# Patient Record
Sex: Female | Born: 2000 | Race: White | Hispanic: No | Marital: Single | Smoking: Never smoker
Health system: Southern US, Community
[De-identification: ages and names within clinical notes are randomized; demographics above are authoritative.]

## PROBLEM LIST (undated history)

## (undated) DIAGNOSIS — R011 Cardiac murmur, unspecified: Secondary | ICD-10-CM

## (undated) DIAGNOSIS — T7840XA Allergy, unspecified, initial encounter: Secondary | ICD-10-CM

## (undated) DIAGNOSIS — J45909 Unspecified asthma, uncomplicated: Secondary | ICD-10-CM

## (undated) DIAGNOSIS — E0590 Thyrotoxicosis, unspecified without thyrotoxic crisis or storm: Secondary | ICD-10-CM

## (undated) HISTORY — PX: TOTAL THYROIDECTOMY: SHX2547

## (undated) HISTORY — DX: Cardiac murmur, unspecified: R01.1

## (undated) HISTORY — DX: Allergy, unspecified, initial encounter: T78.40XA

## (undated) HISTORY — DX: Thyrotoxicosis, unspecified without thyrotoxic crisis or storm: E05.90

## (undated) HISTORY — DX: Unspecified asthma, uncomplicated: J45.909

## (undated) HISTORY — PX: NO PAST SURGERIES: SHX2092

---
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
## 2020-01-08 ENCOUNTER — Telehealth (INDEPENDENT_AMBULATORY_CARE_PROVIDER_SITE_OTHER): Payer: Self-pay | Admitting: Pediatric Endocrinology

## 2020-01-08 DIAGNOSIS — E890 Postprocedural hypothyroidism: Secondary | ICD-10-CM

## 2020-01-08 MED ORDER — LEVOTHYROXINE SODIUM 175 MCG PO TABS: ORAL_TABLET | ORAL | 1 refills | Status: DC

## 2020-01-08 NOTE — Telephone Encounter (Signed)
  Who's calling (name and relationship to patient) : Delcie (self)  Best contact number: [phone]  Provider they see: Dr. Vanessa Weldon  Reason for call: Needs refill sent to pharmacy.    PRESCRIPTION REFILL ONLY  Name of prescription: levothyroxine (SYNTHROID) 175 MCG tablet  Pharmacy:  [id number] [place], [place] - [address]

## 2020-01-08 NOTE — Telephone Encounter (Signed)
Refill sent to pharmacy.   

## 2020-06-09 ENCOUNTER — Encounter (INDEPENDENT_AMBULATORY_CARE_PROVIDER_SITE_OTHER): Payer: Self-pay

## 2020-06-09 DIAGNOSIS — E890 Postprocedural hypothyroidism: Secondary | ICD-10-CM

## 2020-06-10 NOTE — Telephone Encounter (Signed)
Spoke with Dr. Vanessa Roscoe regarding this and she said to put in thyroid labs for patient (TSH, and Free T4)

## 2020-06-17 LAB — TSH: TSH: 2.05 u[IU]/mL (ref 0.450–4.500)

## 2020-06-17 LAB — T4, FREE: Free T4: 1.87 ng/dL — ABNORMAL HIGH (ref 0.93–1.60)

## 2020-06-25 ENCOUNTER — Other Ambulatory Visit (INDEPENDENT_AMBULATORY_CARE_PROVIDER_SITE_OTHER): Payer: Self-pay | Admitting: Pediatric Endocrinology

## 2020-06-25 DIAGNOSIS — E890 Postprocedural hypothyroidism: Secondary | ICD-10-CM

## 2020-07-16 ENCOUNTER — Encounter: Payer: Self-pay | Admitting: Family Medicine

## 2020-07-16 ENCOUNTER — Ambulatory Visit: Payer: BC Managed Care – PPO | Admitting: Family Medicine

## 2020-07-16 ENCOUNTER — Other Ambulatory Visit: Payer: Self-pay

## 2020-07-16 VITALS — BP 116/86 | HR 67 | Temp 98.0°F | Resp 16 | Ht 71.0 in | Wt 246.0 lb

## 2020-07-16 DIAGNOSIS — E669 Obesity, unspecified: Secondary | ICD-10-CM | POA: Diagnosis not present

## 2020-07-16 DIAGNOSIS — R040 Epistaxis: Secondary | ICD-10-CM

## 2020-07-16 DIAGNOSIS — Z6834 Body mass index (BMI) 34.0-34.9, adult: Secondary | ICD-10-CM

## 2020-07-16 DIAGNOSIS — R0681 Apnea, not elsewhere classified: Secondary | ICD-10-CM

## 2020-07-16 NOTE — Patient Instructions (Signed)
Nosebleed, Adult A nosebleed is when blood comes out of the nose. Nosebleeds are common and can be caused by many things. They are usually not a sign of a serious medical problem. Follow these instructions at home: When you have a nosebleed:  Sit down.  Tilt your head a little forward.  Follow these steps: 1. Pinch your nose with a clean towel or tissue. 2. Keep pinching your nose for 5 minutes. Do not let go. 3. After 5 minutes, let go of your nose. 4. If there is still bleeding, do these steps again. Keep doing these steps until the bleeding stops.  Do not put tissues or other things in your nose to stop the bleeding.  Avoid lying down or putting your head back.  Use a nose spray decongestant as told by your doctor.   After a nosebleed:  Try not to blow your nose or sniffle for several hours.  Try not to strain, lift, or bend at the waist for several days.  Aspirin and blood-thinning medicines make bleeding more likely. If you take these medicines: ? Ask your doctor if you should stop taking them or if you should change how much you take. ? Do not stop taking the medicine unless your doctor tells you to.  If your nosebleed was caused by dryness, use over-the-counter saline nasal spray or gel and humidifier as told by your doctor. This will keep the inside of your nose moist and allow it to heal. If you need to use one of these products: ? Choose one that is water-soluble. ? Use only as much as you need and use it only as often as needed. ? Do not lie down right away after you use it.  If you get nosebleeds often, talk with your doctor about treatments. These may include: ? Nasal cautery. A chemical swab or electrical device is used to lightly burn tiny blood vessels inside the nose. This helps stop or prevent nosebleeds. ? Nasal packing. A gauze or other material is placed in the nose to keep constant pressure on the bleeding area. Contact a doctor if:  You have a  fever.  You get nosebleeds often.  You are getting nosebleeds more often than usual.  You bruise very easily.  You have something stuck in your nose.  You have bleeding in your mouth.  You vomit or cough up brown material.  You get a nosebleed after you start a new medicine. Get help right away if:  You have a nosebleed after you fall or hurt your head.  Your nosebleed does not go away after 20 minutes.  You feel dizzy or weak.  You have unusual bleeding from other parts of your body.  You have unusual bruising on other parts of your body.  You get sweaty.  You vomit blood. Summary  Nosebleeds are common. They are usually not a sign of a serious medical problem.  When you have a nosebleed, sit down and tilt your head a little forward. Pinch your nose with a clean tissue for 5 minutes.  Use saline spray or saline gel and a humidifier as told by your doctor.  Get help right away if your nosebleed does not go away after 20 minutes. This information is not intended to replace advice given to you by your health care provider. Make sure you discuss any questions you have with your health care provider. Document Revised: 02/20/2019 Document Reviewed: 02/20/2019 Elsevier Patient Education  2021 [employer] Inc.  

## 2020-07-16 NOTE — Progress Notes (Signed)
Established patient visit   Patient: Kristen Davis   DOB: 2000-11-21   20 y.o. Female  MRN: [id number] Visit Date: 07/16/2020  Today's healthcare provider: Shirlee Latch, MD   Chief Complaint  Patient presents with  . Epistaxis   Subjective    Epistaxis  The bleeding has been from the left nare. This is a new problem. Episode onset: 6 months ago. The problem occurs every several days. The problem has been unchanged. The bleeding is associated with nothing. Treatments tried: vaseline, and humidifier. The treatment provided mild relief. Her past medical history is significant for frequent nosebleeds. There is no history of allergies, a bleeding disorder or sinus problems.    It will bleed for 3 hours and splatter across the floor, soaking towels. She feels lightheaded after an episode.  Can occur up to 3 times daily.  No bleeding from elsewhere or easy bruising. Patient Active Problem List   Diagnosis Date Noted  . 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 GERD (gastroesophageal reflux disease) 01/22/2018  . Epidermal inclusion cyst 07/06/2016  . Epidermoid cyst of face 07/06/2016  . Fatigue 02/09/2015  . Acanthosis nigricans 09/22/2014  . Allergic rhinitis 09/22/2014  . 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 Ejection murmur 09/22/2014  . Irregular bleeding 09/22/2014  . Murmur, cardiac 03/14/2013   Social History   Tobacco Use  . Smoking status: Never Smoker  . Smokeless tobacco: Never Used  Vaping Use  . Vaping Use: Never used  Substance Use Topics  . Alcohol use: No  . Drug use: No   No Known Allergies     Medications: Outpatient Medications Prior to Visit  Medication Sig  . albuterol (VENTOLIN HFA) 108 (90 Base) MCG/ACT inhaler Inhale into the lungs.  . hydrOXYzine (VISTARIL) 100 MG capsule Take 1 capsule (100 mg total) by mouth 3 (three) times daily as needed (for anxiety).  . Levonorgestrel 19.5 MG IUD 19.5 mg by Intrauterine route once.  Marland Kitchen  levothyroxine (SYNTHROID) 175 MCG tablet TAKE 1 TABLET BY MOUTH EVERY DAY  . spironolactone (ALDACTONE) 50 MG tablet   . tretinoin (RETIN-A) 0.05 % cream    No facility-administered medications prior to visit.    Review of Systems  Constitutional: Positive for fatigue. Negative for appetite change and diaphoresis.  HENT: Positive for nosebleeds. Negative for rhinorrhea and sneezing.   Respiratory: Negative for chest tightness and shortness of breath.   Cardiovascular: Negative for chest pain.  Allergic/Immunologic: Negative for environmental allergies.  Neurological: Positive for light-headedness.    Last CBC Lab Results  Component Value Date   WBC 5.1 07/05/2018   HGB 12.2 07/05/2018   HCT 37.3 07/05/2018   MCV 81 07/05/2018   MCH 26.6 07/05/2018   RDW 13.1 07/05/2018   PLT 316 07/05/2018   Last metabolic panel Lab Results  Component Value Date   GLUCOSE 95 11/26/2018   NA 138 11/26/2018   K 4.4 11/26/2018   CL 107 11/26/2018   CO2 26 11/26/2018   BUN 17 11/26/2018   CREATININE 0.85 11/26/2018   GFRNONAA CANCELED 02/09/2015   GFRAA CANCELED 02/09/2015   CALCIUM 9.8 11/26/2018   PROT 7.0 05/28/2018   ALBUMIN 4.3 05/28/2018   LABGLOB 2.7 05/28/2018   AGRATIO 1.6 05/28/2018   BILITOT 0.4 05/28/2018   ALKPHOS 127 (H) 05/28/2018   AST 9 05/28/2018   ALT 10 05/28/2018   Last thyroid functions Lab Results  Component Value Date   TSH 2.050 06/16/2020   T3TOTAL 74 02/03/2019  T4TOTAL 6.5 10/10/2018       Objective    BP 116/86 (BP Location: Right Arm, Patient Position: Sitting, Cuff Size: Large)   Pulse 67   Temp 98 F (36.7 C) (Oral)   Resp 16   Ht 5\' 11"  (1.803 m)   Wt 246 lb (111.6 kg)   SpO2 99%   BMI 34.31 kg/m  BP Readings from Last 3 Encounters:  07/16/20 116/86  04/07/19 118/90  03/18/19 104/80   Wt Readings from Last 3 Encounters:  07/16/20 246 lb (111.6 kg) (>99 %, Z= 2.48)*  10/07/19 245 lb (111.1 kg) (>99 %, Z= 2.44)*  04/07/19 247 lb  (112 kg) (>99 %, Z= 2.44)*   * Growth percentiles are based on CDC (Girls, 2-20 Years) data.       Physical Exam Constitutional:      General: She is not in acute distress.    Appearance: Normal appearance. She is not diaphoretic.  HENT:     Head: Normocephalic.     Nose: Nose normal.     Right Nostril: No foreign body, epistaxis, septal hematoma or occlusion.     Left Nostril: No foreign body, epistaxis, septal hematoma or occlusion.     Right Turbinates: Not enlarged or swollen.     Left Turbinates: Not enlarged or swollen.     Mouth/Throat:     Comments: Large tonsils Eyes:     Conjunctiva/sclera: Conjunctivae normal.  Cardiovascular:     Rate and Rhythm: Normal rate and regular rhythm.     Pulses: Normal pulses.     Heart sounds: Normal heart sounds. No murmur heard.   Pulmonary:     Effort: Pulmonary effort is normal. No respiratory distress.     Breath sounds: Normal breath sounds. No wheezing or rhonchi.  Musculoskeletal:     Cervical back: Neck supple.     Right lower leg: No edema.     Left lower leg: No edema.  Skin:    Coloration: Skin is not pale.     Findings: No bruising.  Neurological:     Mental Status: She is alert and oriented to person, place, and time.       No results found for any visits on 07/16/20.  Assessment & Plan     1. Epistaxis - recurrent issue - seems to be more substantial bleeds - will check platelets, but no bleeding elsewhere, so doubt thrombocytopenia - will check CBC to ensure no anemia given blood loss - referral to ENT for eval and management - discussed humidifier, avoid nasal sprays, avoiding nose picking - CBC - Ambulatory referral to ENT  2. Witnessed episode of apnea 3. Class 1 obesity without serious comorbidity with body mass index (BMI) of 34.0 to 34.9 in adult, unspecified obesity type - witnessed apnea and snoring - large tonsil and obesity may contribute as well - will obtain sleep study - she will also  f/u with ENT - [facility] Sleep Study; Future   Return if symptoms worsen or fail to improve.      I, 09/15/20, MD, have reviewed all documentation for this visit. The documentation on 07/16/20 for the exam, diagnosis, procedures, and orders are all accurate and complete.   Malani Lees, 09/15/20, MD, MPH [facility]

## 2020-07-17 LAB — CBC
Hematocrit: 36.7 % (ref 34.0–46.6)
Hemoglobin: 12 g/dL (ref 11.1–15.9)
MCH: 26.5 pg — ABNORMAL LOW (ref 26.6–33.0)
MCHC: 32.7 g/dL (ref 31.5–35.7)
MCV: 81 fL (ref 79–97)
Platelets: 446 10*3/uL (ref 150–450)
RBC: 4.53 x10E6/uL (ref 3.77–5.28)
RDW: 11.9 % (ref 11.7–15.4)
WBC: 8.2 10*3/uL (ref 3.4–10.8)

## 2020-07-20 ENCOUNTER — Telehealth: Payer: Self-pay

## 2020-07-20 ENCOUNTER — Other Ambulatory Visit (INDEPENDENT_AMBULATORY_CARE_PROVIDER_SITE_OTHER): Payer: Self-pay | Admitting: Pediatric Endocrinology

## 2020-07-20 DIAGNOSIS — E890 Postprocedural hypothyroidism: Secondary | ICD-10-CM

## 2020-07-20 NOTE — Telephone Encounter (Signed)
-----   Message from Erasmo Downer, MD sent at 07/20/2020  1:09 PM EDT ----- Normal labs

## 2020-08-06 ENCOUNTER — Other Ambulatory Visit (INDEPENDENT_AMBULATORY_CARE_PROVIDER_SITE_OTHER): Payer: Self-pay | Admitting: Pediatric Endocrinology

## 2020-08-06 DIAGNOSIS — E890 Postprocedural hypothyroidism: Secondary | ICD-10-CM

## 2020-08-19 ENCOUNTER — Ambulatory Visit: Payer: BC Managed Care – PPO | Attending: Neurology

## 2020-09-08 ENCOUNTER — Telehealth (INDEPENDENT_AMBULATORY_CARE_PROVIDER_SITE_OTHER): Payer: Self-pay | Admitting: Pediatric Endocrinology

## 2020-09-08 DIAGNOSIS — E890 Postprocedural hypothyroidism: Secondary | ICD-10-CM

## 2020-09-08 NOTE — Telephone Encounter (Signed)
Who's calling (name and relationship to patient) : Josette Copes  Best contact number: [phone]  Provider they see: Dr. Vanessa [place]   Reason for call: Would like lab orders sent to [facility] And would like to know if next visit can be virtual Call ID:      PRESCRIPTION REFILL ONLY  Name of prescription:  Pharmacy:

## 2020-09-09 NOTE — Telephone Encounter (Signed)
Spoke with patient and let her know the labs were entered, and that Dr. Vanessa Garrett gave the go ahead for the visit to be virtual.

## 2020-09-13 ENCOUNTER — Telehealth (INDEPENDENT_AMBULATORY_CARE_PROVIDER_SITE_OTHER): Payer: BC Managed Care – PPO | Admitting: Pediatric Endocrinology

## 2020-09-15 LAB — T4, FREE: Free T4: 1.96 ng/dL — ABNORMAL HIGH (ref 0.93–1.60)

## 2020-09-15 LAB — TSH: TSH: 0.87 u[IU]/mL (ref 0.450–4.500)

## 2020-09-22 NOTE — Progress Notes (Signed)
This is a Pediatric Specialist E-Visit follow up consult provided via  VIDEO CARGILITY   Kristen Davis consented to an E-Visit consult today.  Location of patient: Kristen Davis is at work (location) Location of provider: Koren Shiver is at office (location) Patient was referred by Erasmo Downer, MD   The following participants were involved in this E-Visit: Dr. Vanessa Shubert, Darnelle Bos (list of participants and their roles)  This visit was done via VIDEO   Chief Complain/ Reason for E-Visit today: Post Surgical Hypothyroidism Total time on call: 20 min Follow up: 1 year     Subjective:  Subjective  Patient Name: Kristen Davis: 21-Mar-2001  MRN: [id number]  Kristen Davis  presents Via [facility] VIDEO VISIT today for follow up evaluation and management of her hyperthyroidism  HISTORY OF PRESENT ILLNESS:   Kristen Davis is a 20 y.o. Caucasian female   Kristen Davis was accompanied by her self   1. Kristen Davis was seen by her PCP in October 2016 for a chief complaint of syncope during basketball. She stated that she had "blacked out" while shooting hoops and had difficulty recognizing her team mates. Her PCP initially though symptoms were consistent with hypoglycemia but a TSH was obtained and was <0.006. She was referred to endocrinology for further evaluation and management of her hyperthyroidism.  2. Kristen Davis was last seen in Endocrine clinic on 10/07/19 (Virtual). Since then she has continued to do well.   She had her thyroidectomy on 09/26/18.   She has continued on 175 mcg of Synthroid daily.   She has been having trouble sleeping. Her FP doc wants her to do a sleep study. She has continued to have weird dreams. She thinks that she tends to be tired all the time.   She is moving back in with her parents and then moving with her partner to a place in [place].   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   She feels that she has  been a stable weight (~250).  She feels that she is not eating as much as she should be and she is working out- but she has not seen a decrease in weight or an increase in muscle mass. She is frustrated that she is not 16 anymore.   She also has an IUD.   She has continued to have increased hair loss. She does not think that she has any areas that are bald. She thinks she mostly loses hair in the shower. She also had Covid in December.   Acne has improved.   She is not running hot or cold.   No constipation or diarrhea  No palpitations.   She has not had any issues with climbing stairs or walking in the hills.   3. Pertinent Review of Systems: Greater than 10 systems reviewed with pertinent positives listed in HPI, otherwise negative.  PAST MEDICAL, FAMILY, AND SOCIAL HISTORY  Past Medical History:  Diagnosis Date  . Allergy   . Asthma   . Heart murmur   . Hyperthyroidism   Had a history of HSP at age 20 years, hospitalized at [facility] during this time  Family History  Problem Relation Age of Onset  . ADD / ADHD Sister   . Diabetes Father   No family history of hyperthyroidism.  Maternal aunt had hypothyroidism.  No other autoimmunity   Current Outpatient Medications:  .  albuterol (VENTOLIN HFA) 108 (90 Base) MCG/ACT inhaler,  Inhale into the lungs., Disp: , Rfl:  .  hydrOXYzine (VISTARIL) 100 MG capsule, Take 1 capsule (100 mg total) by mouth 3 (three) times daily as needed (for anxiety)., Disp: 15 capsule, Rfl: 1 .  Levonorgestrel 19.5 MG IUD, 19.5 mg by Intrauterine route once., Disp: , Rfl:  .  levothyroxine (SYNTHROID) 175 MCG tablet, TAKE 1 TABLET BY MOUTH EVERY DAY, Disp: 90 tablet, Rfl: 1 .  spironolactone (ALDACTONE) 50 MG tablet, , Disp: , Rfl:  .  tretinoin (RETIN-A) 0.05 % cream, , Disp: , Rfl:   Allergies as of 09/23/2020  . (No Known Allergies)   No recent hospitalizations or surgeries   reports that she has never smoked. She has never used smokeless tobacco.  She reports that she does not drink alcohol and does not use drugs. Pediatric History  Patient Parents  . Davis,Kristen (Mother)   Other Topics Concern  . Not on file  Social History Narrative   Lives at home with mom, dad and sister     Attends [employer].    1. 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 Activities:  3. Primary Care Provider: Erasmo Downer, MD  ROS: There are no other significant problems involving Kristen Davis's other body systems.    Objective:  Objective  Vital Signs:  Virtual visit.   Ht 5\' 11"  (1.803 m)   Wt 248 lb 4.8 oz (112.6 kg)   BMI 34.63 kg/m    Ht Readings from Last 3 Encounters:  09/23/20 5\' 11"  (1.803 m) (>99 %, Z= 2.64)*  07/16/20 5\' 11"  (1.803 m) (>99 %, Z= 2.64)*  04/07/19 5' 9.69" (1.77 m) (98 %, Z= 2.14)*   * Growth percentiles are based on CDC (Girls, 2-20 Years) data.   Wt Readings from Last 3 Encounters:  09/23/20 248 lb 4.8 oz (112.6 kg) (>99 %, Z= 2.50)*  07/16/20 246 lb (111.6 kg) (>99 %, Z= 2.48)*  10/07/19 245 lb (111.1 kg) (>99 %, Z= 2.44)*   * Growth percentiles are based on CDC (Girls, 2-20 Years) data.   HC Readings from Last 3 Encounters:  No data found for [facility]   Body surface area is 2.38 meters squared. >99 %ile (Z= 2.64) based on CDC (Girls, 2-20 Years) Stature-for-age data based on Stature recorded on 09/23/2020. >99 %ile (Z= 2.50) based on CDC (Girls, 2-20 Years) weight-for-age data using vitals from 09/23/2020.   PHYSICAL EXAM:  Virtual Visit  General- appears healthy HEENT- nares clear, MMM, thyroidectomy scar well healed Lungs: normal work of breathing Skin- Normal color  Extremities- feels that she has decreased proximal muscle strength.   Labs:  Results for Kristen, Davis (MRN 09/25/2020) as of 09/22/2020 16:15  Ref. Range 09/14/2020 12:17  TSH Latest Ref Range: 0.450 - 4.500 uIU/mL 0.870  T4,Free(Direct) Latest Ref Range: 0.93 - 1.60 ng/dL [id number] (H)   Office Visit on 07/16/2020   Component Date Value Ref Range Status  . WBC 07/16/2020 8.2  3.4 - 10.8 x10E3/uL Final  . RBC 07/16/2020 4.53  3.77 - 5.28 x10E6/uL Final  . Hemoglobin 07/16/2020 12.0  11.1 - 15.9 g/dL Final  . Hematocrit 09/15/2020 36.7  34.0 - 46.6 % Final  . MCV 07/16/2020 81  79 - 97 fL Final  . MCH 07/16/2020 26.5* 26.6 - 33.0 pg Final  . MCHC 07/16/2020 32.7  31.5 - 35.7 g/dL Final  . RDW 09/15/2020 11.9  11.7 - 15.4 % Final  . Platelets 07/16/2020 446  150 -  450 x10E3/uL Final           Assessment and Plan:   Breanah is a 21 y.o. 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 She is now post thyroidectomy on 09/26/18. She is being managed for post-surgical hypothyroidism.    1. Post Surgical Hypothyroidism - Thyroidectomy on 09/26/18 by Dr. Farley Ly at [facility] - Had extrathyroidal thyroid tissue which was also removed and had similar pathology to the primary gland - Now on 175 mcg of Synthroid daily  - Clinically and chemically over-treated vs residual thyroid tissue - will decrease back to 150 mcg   - Will plan for repeat labs and dose adjustment between visits  No follow-ups on file.    [name], MD   >30 minutes spent today reviewing the medical chart, counseling the patient/family, and documenting today's encounter.

## 2020-09-23 ENCOUNTER — Telehealth (INDEPENDENT_AMBULATORY_CARE_PROVIDER_SITE_OTHER): Payer: BC Managed Care – PPO | Admitting: Pediatric Endocrinology

## 2020-09-23 ENCOUNTER — Encounter (INDEPENDENT_AMBULATORY_CARE_PROVIDER_SITE_OTHER): Payer: Self-pay | Admitting: Pediatric Endocrinology

## 2020-09-23 ENCOUNTER — Encounter (INDEPENDENT_AMBULATORY_CARE_PROVIDER_SITE_OTHER): Payer: Self-pay

## 2020-09-23 ENCOUNTER — Other Ambulatory Visit: Payer: Self-pay

## 2020-09-23 VITALS — Ht 71.0 in | Wt 248.3 lb

## 2020-09-23 DIAGNOSIS — E890 Postprocedural hypothyroidism: Secondary | ICD-10-CM | POA: Diagnosis not present

## 2020-09-23 MED ORDER — LEVOTHYROXINE SODIUM 150 MCG PO TABS: 150.0000 ug | ORAL_TABLET | Freq: Every day | ORAL | 3 refills | Status: DC

## 2020-10-15 ENCOUNTER — Telehealth (INDEPENDENT_AMBULATORY_CARE_PROVIDER_SITE_OTHER): Payer: Self-pay | Admitting: Pediatric Endocrinology

## 2020-10-15 NOTE — Telephone Encounter (Signed)
Who's calling (name and relationship to patient) : Kristen Davis  Best contact number: [phone].  Provider they see: Dr. Vanessa Premont  Reason for call:   Call ID:      PRESCRIPTION REFILL ONLY  Name of prescription: Levothyroxine  Pharmacy: [facility] [place] [address]

## 2020-10-18 ENCOUNTER — Other Ambulatory Visit (INDEPENDENT_AMBULATORY_CARE_PROVIDER_SITE_OTHER): Payer: Self-pay

## 2020-10-18 DIAGNOSIS — E890 Postprocedural hypothyroidism: Secondary | ICD-10-CM

## 2020-10-18 MED ORDER — LEVOTHYROXINE SODIUM 150 MCG PO TABS: 150.0000 ug | ORAL_TABLET | Freq: Every day | ORAL | 3 refills | Status: DC

## 2020-10-18 NOTE — Telephone Encounter (Signed)
Refill sent.

## 2020-11-01 ENCOUNTER — Telehealth (INDEPENDENT_AMBULATORY_CARE_PROVIDER_SITE_OTHER): Payer: Self-pay | Admitting: Pediatric Endocrinology

## 2020-11-01 NOTE — Telephone Encounter (Signed)
Lab orders were placed last month for patient to get done. There is a message scheduled for tomorrow, 6/28, letting patient that the labs have been placed and she is due to get labs done.

## 2020-11-01 NOTE — Telephone Encounter (Signed)
Patient also notified.  

## 2020-11-01 NOTE — Telephone Encounter (Signed)
  Who's calling (name and relationship to patient) :[name] (Self)  Best contact number: [phone] (Home) Provider they see: [name], MD Reason for call: B[place] is requesting lab orders be sent to [facility]. If there are any questions or concerns please contact [place] and or when the order is complete confirm.    PRESCRIPTION REFILL ONLY  Name of prescription:  Pharmacy:

## 2020-11-03 LAB — T4, FREE: Free T4: 1.38 ng/dL (ref 0.82–1.77)

## 2020-11-03 LAB — TSH: TSH: 4.42 u[IU]/mL (ref 0.450–4.500)

## 2020-11-25 ENCOUNTER — Encounter: Payer: Self-pay | Admitting: Family Medicine

## 2020-11-25 ENCOUNTER — Ambulatory Visit: Payer: BC Managed Care – PPO | Admitting: Family Medicine

## 2020-11-25 VITALS — BP 123/69 | HR 73 | Temp 98.2°F | Resp 16 | Wt 244.5 lb

## 2020-11-25 DIAGNOSIS — R41840 Attention and concentration deficit: Secondary | ICD-10-CM | POA: Diagnosis not present

## 2020-11-25 DIAGNOSIS — E890 Postprocedural hypothyroidism: Secondary | ICD-10-CM | POA: Diagnosis not present

## 2020-11-25 DIAGNOSIS — R5382 Chronic fatigue, unspecified: Secondary | ICD-10-CM

## 2020-11-25 NOTE — Progress Notes (Signed)
Established patient visit   Patient: Kristen Davis   DOB: 2001/01/13   20 y.o. Female  MRN: [id number] Visit Date: 11/25/2020  Today's healthcare provider: Shirlee Latch, MD   Chief Complaint  Patient presents with   ADHD   Subjective    HPI HPI   Initial Evaluation Last edited by Fonda Kinder, CMA on 11/25/2020 10:38 AM.    Concentration deficit - Pt. requests evaluation for adult ADD - Pt. describes recent difficulty focusing, staying on task since transitioning to virtual education during COVID; pt. states that she has difficulty reading/writing efficiently - Pt. reports that she chronically fidgets and is often told that she "talks a lot"   Post-Surgical Hypothyroidism - Thyroid labs WNL in 6/22; pt. requests to be followed by PCP since she's aging out of peds endo - Pt. endorses decreased appetite, increased sleepiness since thyroidectomy; denies cold/heat intolerance   Medications: Outpatient Medications Prior to Visit  Medication Sig   albuterol (VENTOLIN HFA) 108 (90 Base) MCG/ACT inhaler Inhale into the lungs.   hydrOXYzine (VISTARIL) 100 MG capsule Take 1 capsule (100 mg total) by mouth 3 (three) times daily as needed (for anxiety).   Levonorgestrel 19.5 MG IUD 19.5 mg by Intrauterine route once.   levothyroxine (SYNTHROID) 150 MCG tablet Take 1 tablet (150 mcg total) by mouth daily.   spironolactone (ALDACTONE) 50 MG tablet    tretinoin (RETIN-A) 0.05 % cream    No facility-administered medications prior to visit.    Review of Systems  Constitutional:  Positive for activity change and appetite change. Negative for fever and unexpected weight change.  HENT: Negative.    Eyes: Negative.   Respiratory: Negative.  Negative for chest tightness and shortness of breath.   Cardiovascular: Negative.  Negative for chest pain and palpitations.  Gastrointestinal: Negative.   Endocrine: Negative.  Negative for cold intolerance and heat intolerance.   Musculoskeletal: Negative.   Skin: Negative.   Neurological: Negative.   Psychiatric/Behavioral:  Positive for decreased concentration.       Objective    BP 123/69   Pulse 73   Temp 98.2 F (36.8 C) (Oral)   Resp 16   Wt 244 lb 8 oz (110.9 kg)   LMP  (Within Weeks)   SpO2 98%   BMI 34.10 kg/m     Physical Exam Constitutional:      Appearance: Normal appearance. She is obese.  HENT:     Head: Normocephalic and atraumatic.     Right Ear: External ear normal.     Left Ear: External ear normal.  Eyes:     Conjunctiva/sclera: Conjunctivae normal.  Cardiovascular:     Rate and Rhythm: Normal rate and regular rhythm.     Pulses: Normal pulses.     Heart sounds: Normal heart sounds.  Pulmonary:     Effort: Pulmonary effort is normal.     Breath sounds: Normal breath sounds.  Skin:    General: Skin is warm and dry.  Neurological:     Mental Status: She is alert.  Psychiatric:        Behavior: Behavior normal.        Thought Content: Thought content normal.     No results found for any visits on 11/25/20.  Assessment & Plan     Problem List Items Addressed This Visit       Endocrine   Postsurgical hypothyroidism    Stable, will recheck thyroid panel at next visit  Other Visit Diagnoses     Concentration deficit    -  Primary - new problem - needs neuropsych eval before starting meds   Relevant Orders   Ambulatory referral to Neuropsychology   Chronic fatigue     Stable, will recheck thyroid panel at next visit        Return in about 6 months (around 05/28/2021) for CPE.      Queen Blossom, MS3  Patient seen along with MS3 student Queen Blossom. I personally evaluated this patient along with the student, and verified all aspects of the history, physical exam, and medical decision making as documented by the student. I agree with the student's documentation and have made all necessary edits.  Fadia Marlar, Marzella Schlein, MD, MPH [facility]

## 2020-11-25 NOTE — Assessment & Plan Note (Signed)
Stable, will recheck thyroid panel at next visit

## 2021-01-20 ENCOUNTER — Ambulatory Visit: Payer: Self-pay | Admitting: *Deleted

## 2021-01-20 ENCOUNTER — Other Ambulatory Visit: Payer: Self-pay

## 2021-01-20 DIAGNOSIS — R21 Rash and other nonspecific skin eruption: Secondary | ICD-10-CM

## 2021-01-20 MED ORDER — TRIAMCINOLONE ACETONIDE 0.1 % EX CREA: 1.0000 "application " | TOPICAL_CREAM | Freq: Two times a day (BID) | CUTANEOUS | 0 refills | Status: DC

## 2021-01-20 NOTE — Telephone Encounter (Signed)
Please send in triamcinolone ointment to use BID for rash. If not improving, can see [place] if available or Derm.

## 2021-01-20 NOTE — Telephone Encounter (Signed)
Per agent: " "Pt states her boyfriend gave her a necklace and it made her neck really itchy.  She cannot get it to stop.  She wants to know does she see dermatologist for that, or her pcp?"  Pt reports wore a necklace 2 weeks ago, rash at neck occurred "Where neck meets shoulders and a little on back of neck." States did not wear necklace for a while, resolved, then used perfume, reoccurred. Presently "Bumps under skin like I always have from my autoimmune disease."  Itching when rash present, not presently. Pt questioning if she should see her dermatologist. Assured pt NT would route to practice for PCPs review. Advised to alert dermatologist where she is established. Pt verbalizes understanding.     Reason for Disposition  Localized rash present > 7 days  Answer Assessment - Initial Assessment Questions 1. APPEARANCE of RASH: "Describe the rash."     BUmps under skin presently 2. LOCATION: "Where is the rash located?"      "Neck where shoulders meet, little at back of neck" 3. NUMBER: "How many spots are there?"      VAries. "Broke out 2 weeks ago from necklace" 4. SIZE: "How big are the spots?" (Inches, centimeters or compare to size of a coin)      Small 5. ONSET: "When did the rash start?"       2 weeks ago "But I've always had some bumps." 6. ITCHING: "Does the rash itch?" If Yes, ask: "How bad is the itch?"  (Scale 0-10; or none, mild, moderate, severe)     Yes a times 7. PAIN: "Does the rash hurt?" If Yes, ask: "How bad is the pain?"  (Scale 0-10; or none, mild, moderate, severe)    - NONE (0): no pain    - MILD (1-3): doesn't interfere with normal activities     - MODERATE (4-7): interferes with normal activities or awakens from sleep     - SEVERE (8-10): excruciating pain, unable to do any normal activities     no 8. OTHER SYMPTOMS: "Do you have any other symptoms?" (e.g., fever)     no  Protocols used: Rash or Redness - Localized-A-AH

## 2021-03-05 IMAGING — US US BREAST*R* LIMITED INC AXILLA
1 series · 2 of 2 positions shown · non-contrast
Comparison: Previous exam(s).

CLINICAL DATA: Patient presents as a new palpable area of concern
in the superior right breast.

EXAM:
ULTRASOUND OF THE RIGHT BREAST

[Series 1: us breast*right* limited inc axilla · 0.06mm/px · 2 of 2 slices shown]
[im 1/2]
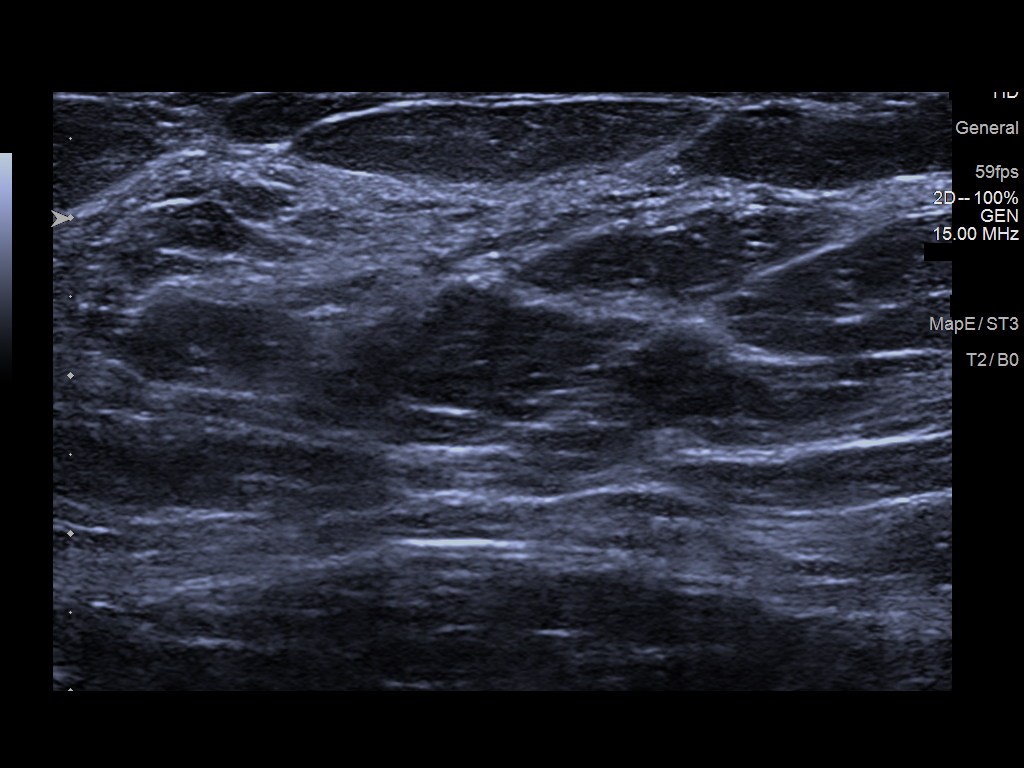
[im 2/2]
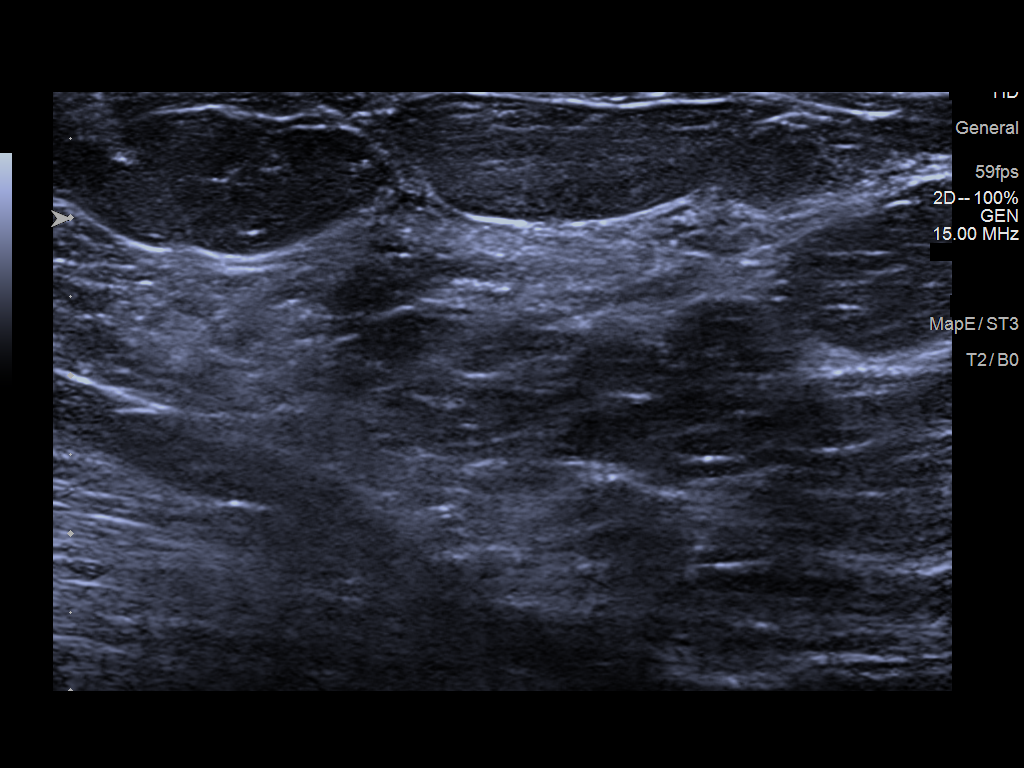

[2 of 2 positions shown; findings below may reference images not displayed]

FINDINGS: On physical exam, I palpate a smooth ridge in the superior aspect of
the right breast. No discrete fixed mass.

Targeted ultrasound is performed in the superior right breast at 12
o'clock 6 cm from the nipple in the palpable area concern
demonstrating no discrete cystic or solid mass. There is a thick
band of tissue which likely corresponds to the palpable finding.
IMPRESSION: No sonographic evidence of malignancy in the right breast. No
discrete mass identified sonographically to correspond to the
palpable finding, which is favored to represent a band of tissue.

RECOMMENDATION:
Clinical follow-up as needed for the right breast. Patient was
counseled to perform routine breast exams of this area and to return
if she notices any change in the exam.

I have discussed the findings and recommendations with the patient.
If applicable, a reminder letter will be sent to the patient
regarding the next appointment.

BI-RADS CATEGORY  1: Negative.

## 2021-05-20 ENCOUNTER — Other Ambulatory Visit: Payer: Self-pay | Admitting: Family Medicine

## 2021-05-20 DIAGNOSIS — R21 Rash and other nonspecific skin eruption: Secondary | ICD-10-CM

## 2021-06-06 ENCOUNTER — Ambulatory Visit (INDEPENDENT_AMBULATORY_CARE_PROVIDER_SITE_OTHER): Payer: BC Managed Care – PPO | Admitting: Family Medicine

## 2021-06-06 ENCOUNTER — Other Ambulatory Visit: Payer: Self-pay

## 2021-06-06 ENCOUNTER — Encounter: Payer: Self-pay | Admitting: Family Medicine

## 2021-06-06 VITALS — BP 122/63 | HR 83 | Temp 97.8°F | Resp 16 | Ht 72.0 in | Wt 241.8 lb

## 2021-06-06 DIAGNOSIS — Z114 Encounter for screening for human immunodeficiency virus [HIV]: Secondary | ICD-10-CM

## 2021-06-06 DIAGNOSIS — Z23 Encounter for immunization: Secondary | ICD-10-CM | POA: Diagnosis not present

## 2021-06-06 DIAGNOSIS — E890 Postprocedural hypothyroidism: Secondary | ICD-10-CM | POA: Diagnosis not present

## 2021-06-06 DIAGNOSIS — F909 Attention-deficit hyperactivity disorder, unspecified type: Secondary | ICD-10-CM

## 2021-06-06 DIAGNOSIS — Z0000 Encounter for general adult medical examination without abnormal findings: Secondary | ICD-10-CM | POA: Diagnosis not present

## 2021-06-06 DIAGNOSIS — E669 Obesity, unspecified: Secondary | ICD-10-CM

## 2021-06-06 DIAGNOSIS — Z6832 Body mass index (BMI) 32.0-32.9, adult: Secondary | ICD-10-CM

## 2021-06-06 DIAGNOSIS — Z1159 Encounter for screening for other viral diseases: Secondary | ICD-10-CM

## 2021-06-06 NOTE — Addendum Note (Signed)
Addended by: Fonda Kinder on: 06/06/2021 03:46 PM   Modules accepted: Orders

## 2021-06-06 NOTE — Assessment & Plan Note (Signed)
Previously well controlled Continue Synthroid at current dose  Recheck TSH and adjust Synthroid as indicated   

## 2021-06-06 NOTE — Assessment & Plan Note (Signed)
F/b [facility] No improvement with strattera Doing well on vyvanse

## 2021-06-06 NOTE — Progress Notes (Signed)
Complete physical exam   Patient: Kristen Davis   DOB: May 19, 2000   21 y.o. Female  MRN: [id number] Visit Date: 06/06/2021  Today's healthcare provider: Lavon Paganini, MD   Chief Complaint  Patient presents with   Annual Exam   Subjective    Kristen Davis is a 21 y.o. female who presents today for a complete physical exam.  She reports consuming a general diet. Gym/ health club routine includes mod to heavy weightlifting and walking. She generally feels well. She reports sleeping well. She does not have additional problems to discuss today.  HPI  IUD in place Declines STD testing  Past Medical History:  Diagnosis Date   Allergy    Asthma    Heart murmur    Hyperthyroidism    Past Surgical History:  Procedure Laterality Date   NO PAST SURGERIES     TOTAL THYROIDECTOMY     tubes in ears Bilateral 2004   Social History   Socioeconomic History   Marital status: Single    Spouse name: Not on file   Number of children: Not on file   Years of education: Not on file   Highest education level: Not on file  Occupational History   Not on file  Tobacco Use   Smoking status: Never   Smokeless tobacco: Never  Vaping Use   Vaping Use: Never used  Substance and Sexual Activity   Alcohol use: No   Drug use: No   Sexual activity: Yes    Birth control/protection: I.U.D.    Comment: Kyleena  Other Topics Concern   Not on file  Social History Narrative   Lives at home with mom, dad and sister     Attends [employer].    Social Determinants of Health   Financial Resource Strain: Not on file  Food Insecurity: Not on file  Transportation Needs: Not on file  Physical Activity: Not on file  Stress: Not on file  Social Connections: Not on file  Intimate Partner Violence: Not on file   Family Status  Relation Name Status   Sister  Alive   Father  Alive   Mother  Alive   MGM  Alive   MGF  Deceased   PGM  Alive   PGF  Alive   Family History  Problem  Relation Age of Onset   ADD / ADHD Sister    Diabetes Father    No Known Allergies  Patient Care Team: Maclaine Ahola, Dionne Bucy, MD as PCP - General (Family Medicine)   Medications: Outpatient Medications Prior to Visit  Medication Sig   albuterol (VENTOLIN HFA) 108 (90 Base) MCG/ACT inhaler Inhale into the lungs.   hydrOXYzine (VISTARIL) 100 MG capsule Take 1 capsule (100 mg total) by mouth 3 (three) times daily as needed (for anxiety).   Levonorgestrel 19.5 MG IUD 19.5 mg by Intrauterine route once.   levothyroxine (SYNTHROID) 150 MCG tablet Take 1 tablet (150 mcg total) by mouth daily.   spironolactone (ALDACTONE) 50 MG tablet    tretinoin (RETIN-A) 0.05 % cream    triamcinolone cream (KENALOG) 0.1 % APPLY TO AFFECTED AREA TWICE A DAY   VYVANSE 60 MG capsule Take 60 mg by mouth daily.   No facility-administered medications prior to visit.    Review of Systems  Constitutional: Negative.   HENT: Negative.    Eyes: Negative.   Respiratory: Negative.    Cardiovascular: Negative.   Gastrointestinal: Negative.   Endocrine: Negative.  Genitourinary: Negative.   Musculoskeletal: Negative.   Skin: Negative.   Allergic/Immunologic: Negative.   Neurological: Negative.   Hematological: Negative.   Psychiatric/Behavioral:  Positive for sleep disturbance.      Objective    BP 122/63 (BP Location: Left Arm, Patient Position: Sitting, Cuff Size: Large)    Pulse 83    Temp 97.8 F (36.6 C) (Oral)    Resp 16    Ht 6' (1.829 m)    Wt 241 lb 12.8 oz (109.7 kg)    BMI 32.79 kg/m    Physical Exam Vitals reviewed.  Constitutional:      General: She is not in acute distress.    Appearance: Normal appearance. She is well-developed. She is not diaphoretic.  HENT:     Head: Normocephalic and atraumatic.  Eyes:     General: No scleral icterus.    Conjunctiva/sclera: Conjunctivae normal.  Neck:     Thyroid: No thyromegaly.  Cardiovascular:     Rate and Rhythm: Normal rate and regular  rhythm.     Pulses: Normal pulses.     Heart sounds: Normal heart sounds. No murmur heard. Pulmonary:     Effort: Pulmonary effort is normal. No respiratory distress.     Breath sounds: Normal breath sounds. No wheezing, rhonchi or rales.  Abdominal:     General: There is no distension.     Palpations: Abdomen is soft.     Tenderness: There is no abdominal tenderness.  Musculoskeletal:     Cervical back: Neck supple.     Right lower leg: No edema.     Left lower leg: No edema.  Lymphadenopathy:     Cervical: No cervical adenopathy.  Skin:    General: Skin is warm and dry.     Findings: No rash.  Neurological:     Mental Status: She is alert and oriented to person, place, and time. Mental status is at baseline.  Psychiatric:        Mood and Affect: Mood normal.        Behavior: Behavior normal.     Last depression screening scores PHQ 2/9 Scores 06/06/2021 07/16/2020  PHQ - 2 Score 0 2  PHQ- 9 Score - 10   Last fall risk screening Fall Risk  06/06/2021  Falls in the past year? 0  Number falls in past yr: 0  Injury with Fall? 0   Last Audit-C alcohol use screening Alcohol Use Disorder Test (AUDIT) 07/16/2020  1. How often do you have a drink containing alcohol? 0  2. How many drinks containing alcohol do you have on a typical day when you are drinking? 0  3. How often do you have six or more drinks on one occasion? 0  AUDIT-C Score 0  Alcohol Brief Interventions/Follow-up AUDIT Score <7 follow-up not indicated   A score of 3 or more in women, and 4 or more in men indicates increased risk for alcohol abuse, EXCEPT if all of the points are from question 1   No results found for any visits on 06/06/21.  Assessment & Plan    Routine Health Maintenance and Physical Exam  Exercise Activities and Dietary recommendations  Goals   None     Immunization History  Administered Date(s) Administered   DTaP 01/17/2001, 03/06/2001, 04/19/2001, 04/18/2002, 01/27/2005   HPV  9-valent 12/03/2014   HPV Quadrivalent 06/03/2012, 03/13/2013, 12/03/2014   Hepatitis A 03/13/2006, 03/27/2007   Hepatitis B 12/03/00, 08/16/2001   HiB (PRP-OMP) 01/17/2001, 03/06/2001, 04/19/2001, 11/25/2001  IPV 01/17/2001, 03/06/2001, 04/18/2002, 01/27/2005   Influenza,inj,Quad PF,6+ Mos 03/13/2013, 03/17/2014, 01/22/2018   Influenza-Unspecified 03/17/2014, 03/16/2021   MMR 11/25/2001, 01/27/2005   Meningococcal Conjugate 03/13/2013   Moderna Sars-Covid-2 Vaccination 01/27/2020, 02/05/2020   Pneumococcal-Unspecified 03/06/2001, 04/19/2001, 08/16/2001, 04/18/2002   Td 05/16/2011   Tdap 05/16/2011   Varicella 11/25/2001, 03/13/2006    Health Maintenance  Topic Date Due   HIV Screening  Never done   Hepatitis C Screening  Never done   CHLAMYDIA SCREENING  03/17/2020   COVID-19 Vaccine (3 - Booster) 04/01/2020   TETANUS/TDAP  05/15/2021   INFLUENZA VACCINE  Completed   HPV VACCINES  Completed    Discussed health benefits of physical activity, and encouraged her to engage in regular exercise appropriate for her age and condition.  Problem List Items Addressed This Visit       Endocrine   Postsurgical hypothyroidism    Previously well controlled Continue Synthroid at current dose  Recheck TSH and adjust Synthroid as indicated        Relevant Orders   TSH + free T4     Other   ADHD    F/b [facility] No improvement with strattera Doing well on vyvanse      Other Visit Diagnoses     Encounter for annual physical exam    -  Primary   Relevant Orders   TSH + free T4   Comprehensive metabolic panel   Lipid panel   HIV antibody (with reflex)   Hepatitis C Antibody   Class 1 obesity without serious comorbidity with body mass index (BMI) of 32.0 to 32.9 in adult, unspecified obesity type       Relevant Medications   VYVANSE 60 MG capsule   Other Relevant Orders   Comprehensive metabolic panel   Lipid panel   Encounter for screening for HIV        Relevant Orders   HIV antibody (with reflex)   Need for hepatitis C screening test       Relevant Orders   Hepatitis C Antibody        Return in about 1 year (around 06/06/2022) for CPE.     I, Lavon Paganini, MD, have reviewed all documentation for this visit. The documentation on 06/06/21 for the exam, diagnosis, procedures, and orders are all accurate and complete.   Tavionna Grout, Dionne Bucy, MD, MPH [facility]

## 2021-06-07 LAB — COMPREHENSIVE METABOLIC PANEL
ALT: 9 IU/L (ref 0–32)
AST: 12 IU/L (ref 0–40)
Albumin/Globulin Ratio: 2 (ref 1.2–2.2)
Albumin: 4.7 g/dL (ref 3.9–5.0)
Alkaline Phosphatase: 68 IU/L (ref 42–106)
BUN/Creatinine Ratio: 19 (ref 9–23)
BUN: 17 mg/dL (ref 6–20)
Bilirubin Total: 0.5 mg/dL (ref 0.0–1.2)
CO2: 25 mmol/L (ref 20–29)
Calcium: 9.5 mg/dL (ref 8.7–10.2)
Chloride: 102 mmol/L (ref 96–106)
Creatinine, Ser: 0.91 mg/dL (ref 0.57–1.00)
Globulin, Total: 2.3 g/dL (ref 1.5–4.5)
Glucose: 93 mg/dL (ref 70–99)
Potassium: 4 mmol/L (ref 3.5–5.2)
Sodium: 141 mmol/L (ref 134–144)
Total Protein: 7 g/dL (ref 6.0–8.5)
eGFR: 93 mL/min/{1.73_m2} (ref >59)

## 2021-06-07 LAB — LIPID PANEL
Chol/HDL Ratio: 3.4 ratio (ref 0.0–4.4)
Cholesterol, Total: 142 mg/dL (ref 100–199)
HDL: 42 mg/dL (ref >39)
LDL Chol Calc (NIH): 75 mg/dL (ref 0–99)
Triglycerides: 145 mg/dL (ref 0–149)
VLDL Cholesterol Cal: 25 mg/dL (ref 5–40)

## 2021-06-07 LAB — TSH+FREE T4
Free T4: 1.56 ng/dL (ref 0.82–1.77)
TSH: 5.44 u[IU]/mL — ABNORMAL HIGH (ref 0.450–4.500)

## 2021-06-07 LAB — HIV ANTIBODY (ROUTINE TESTING W REFLEX): HIV Screen 4th Generation wRfx: NONREACTIVE

## 2021-06-07 LAB — HEPATITIS C ANTIBODY: Hep C Virus Ab: <0.1 s/co ratio (ref 0.0–0.9)

## 2021-08-19 NOTE — Progress Notes (Signed)
? ? ?I,Sulibeya S Dimas,acting as a scribe for Lavon Paganini, MD.,have documented all relevant documentation on the behalf of Lavon Paganini, MD,as directed by  Lavon Paganini, MD while in the presence of Lavon Paganini, MD. ? ? ?Complete physical exam ? ? ?Patient: Kristen Davis   DOB: 03-14-2001   21 y.o. Female  MRN: [id number] ?Visit Date: 08/22/2021 ? ?Today's healthcare provider: Lavon Paganini, MD  ? ?Chief Complaint  ?Patient presents with  ?? Annual Exam  ? ?Subjective  ?  ?Kristen Davis is a 21 y.o. female who presents today for a complete physical exam.  ?She reports consuming a general diet. Gym/ health club routine includes cardio. She generally feels well. She reports sleeping well. She does not have additional problems to discuss today.  ?HPI  ? ? ?Past Medical History:  ?Diagnosis Date  ?? Allergy   ?? Asthma   ?? Heart murmur   ?? Hyperthyroidism   ? ?Past Surgical History:  ?Procedure Laterality Date  ?? NO PAST SURGERIES    ?? TOTAL THYROIDECTOMY    ?? tubes in ears Bilateral 2004  ? ?Social History  ? ?Socioeconomic History  ?? Marital status: Single  ?  Spouse name: Not on file  ?? Number of children: Not on file  ?? Years of education: Not on file  ?? Highest education level: Not on file  ?Occupational History  ?? Not on file  ?Tobacco Use  ?? Smoking status: Never  ?? Smokeless tobacco: Never  ?Vaping Use  ?? Vaping Use: Never used  ?Substance and Sexual Activity  ?? Alcohol use: No  ?? Drug use: No  ?? Sexual activity: Yes  ?  Birth control/protection: I.U.D.  ?  Comment: Kyleena  ?Other Topics Concern  ?? Not on file  ?Social History Narrative  ? Lives at home with mom, dad and sister    ? Attends [employer].   ? ?Social Determinants of Health  ? ?Financial Resource Strain: Not on file  ?Food Insecurity: Not on file  ?Transportation Needs: Not on file  ?Physical Activity: Not on file  ?Stress: Not on file  ?Social Connections: Not on file  ?Intimate Partner Violence: Not on  file  ? ?Family Status  ?Relation Name Status  ?? Sister  Alive  ?? Father  Alive  ?? Mother  Alive  ?? MGM  Alive  ?? MGF  Deceased  ?? PGM  Alive  ?? PGF  Alive  ? ?Family History  ?Problem Relation Age of Onset  ?? ADD / ADHD Sister   ?? Diabetes Father   ? ?No Known Allergies  ?Patient Care Team: ?Virginia Crews, MD as PCP - General (Family Medicine)  ? ?Medications: ?Outpatient Medications Prior to Visit  ?Medication Sig  ?? albuterol (VENTOLIN HFA) 108 (90 Base) MCG/ACT inhaler Inhale into the lungs.  ?? hydrOXYzine (VISTARIL) 100 MG capsule Take 1 capsule (100 mg total) by mouth 3 (three) times daily as needed (for anxiety).  ?? Levonorgestrel 19.5 MG IUD 19.5 mg by Intrauterine route once.  ?? levothyroxine (SYNTHROID) 150 MCG tablet Take 1 tablet (150 mcg total) by mouth daily.  ?? spironolactone (ALDACTONE) 50 MG tablet   ?? tretinoin (RETIN-A) 0.05 % cream   ?? triamcinolone cream (KENALOG) 0.1 % APPLY TO AFFECTED AREA TWICE A DAY  ?? VYVANSE 60 MG capsule Take 60 mg by mouth daily.  ? ?No facility-administered medications prior to visit.  ? ? ?Review of Systems  ?All other systems reviewed  and are negative. ? ? ? Objective  ? ?  ?BP 122/89 (BP Location: Left Arm, Patient Position: Sitting, Cuff Size: Large)   Pulse 70   Temp 98.4 ?F (36.9 ?C) (Oral)   Resp 16   Ht 5' 10"  (1.778 m)   Wt 241 lb 12.8 oz (109.7 kg)   SpO2 99%   BMI 34.69 kg/m?  ? ? ? ?Physical Exam ?Vitals reviewed.  ?Constitutional:   ?   General: She is not in acute distress. ?   Appearance: Normal appearance. She is well-developed. She is not diaphoretic.  ?HENT:  ?   Head: Normocephalic and atraumatic.  ?   Right Ear: Tympanic membrane, ear canal and external ear normal.  ?   Left Ear: Tympanic membrane, ear canal and external ear normal.  ?   Nose: Nose normal.  ?   Mouth/Throat:  ?   Mouth: Mucous membranes are moist.  ?   Pharynx: Oropharynx is clear. No oropharyngeal exudate.  ?Eyes:  ?   General: No scleral icterus. ?    Conjunctiva/sclera: Conjunctivae normal.  ?   Pupils: Pupils are equal, round, and reactive to light.  ?Neck:  ?   Thyroid: No thyromegaly.  ?Cardiovascular:  ?   Rate and Rhythm: Normal rate and regular rhythm.  ?   Pulses: Normal pulses.  ?   Heart sounds: Normal heart sounds. No murmur heard. ?Pulmonary:  ?   Effort: Pulmonary effort is normal. No respiratory distress.  ?   Breath sounds: Normal breath sounds. No wheezing or rales.  ?Abdominal:  ?   General: There is no distension.  ?   Palpations: Abdomen is soft.  ?   Tenderness: There is no abdominal tenderness.  ?Musculoskeletal:     ?   General: No deformity.  ?   Cervical back: Neck supple.  ?   Right lower leg: No edema.  ?   Left lower leg: No edema.  ?Lymphadenopathy:  ?   Cervical: No cervical adenopathy.  ?Skin: ?   General: Skin is warm and dry.  ?   Findings: No rash.  ?Neurological:  ?   Mental Status: She is alert and oriented to person, place, and time. Mental status is at baseline.  ?   Gait: Gait normal.  ?Psychiatric:     ?   Mood and Affect: Mood normal.     ?   Behavior: Behavior normal.     ?   Thought Content: Thought content normal.  ?  ? ? ?Last depression screening scores ? ?  08/22/2021  ?  9:35 AM 06/06/2021  ?  3:13 PM 07/16/2020  ?  9:30 AM  ?PHQ 2/9 Scores  ?PHQ - 2 Score 0 0 2  ?PHQ- 9 Score 3  10  ? ?Last fall risk screening ? ?  08/22/2021  ?  9:35 AM  ?Fall Risk   ?Falls in the past year? 0  ?Number falls in past yr: 0  ?Injury with Fall? 0  ?Risk for fall due to : No Fall Risks  ?Follow up Falls evaluation completed  ? ?Last Audit-C alcohol use screening ? ?  08/22/2021  ?  9:35 AM  ?Alcohol Use Disorder Test (AUDIT)  ?1. How often do you have a drink containing alcohol? 2  ?2. How many drinks containing alcohol do you have on a typical day when you are drinking? 0  ?3. How often do you have six or more drinks on one occasion? 0  ?  AUDIT-C Score 2  ? ?A score of 3 or more in women, and 4 or more in men indicates increased risk for  alcohol abuse, EXCEPT if all of the points are from question 1  ? ?Results for orders placed or performed in visit on 08/22/21  ?POCT Urinalysis Dipstick  ?Result Value Ref Range  ? Color, UA yellow   ? Clarity, UA clear   ? Glucose, UA Negative Negative  ? Bilirubin, UA Negative   ? Ketones, UA Negative   ? Spec Grav, UA 1.015 1.010 - 1.025  ? Blood, UA Negative   ? pH, UA 6.0 5.0 - 8.0  ? Protein, UA Negative Negative  ? Urobilinogen, UA 0.2 0.2 or 1.0 E.U./dL  ? Nitrite, UA Negative   ? Leukocytes, UA Negative Negative  ? ? Assessment & Plan  ?  ?Routine Health Maintenance and Physical Exam ? ?Exercise Activities and Dietary recommendations ? Goals   ?None ?  ? ? ?Immunization History  ?Administered Date(s) Administered  ?? DTaP 01/17/2001, 03/06/2001, 04/19/2001, 04/18/2002, 01/27/2005  ?? HPV 9-valent 12/03/2014  ?? HPV Quadrivalent 06/03/2012, 03/13/2013, 12/03/2014  ?? Hepatitis A 03/13/2006, 03/27/2007  ?? Hepatitis B 11-Oct-2000, 08/16/2001  ?? HiB (PRP-OMP) 01/17/2001, 03/06/2001, 04/19/2001, 11/25/2001  ?? IPV 01/17/2001, 03/06/2001, 04/18/2002, 01/27/2005  ?? Influenza,inj,Quad PF,6+ Mos 03/13/2013, 03/17/2014, 01/22/2018  ?? Influenza-Unspecified 03/17/2014, 03/16/2021  ?? MMR 11/25/2001, 01/27/2005  ?? Meningococcal Conjugate 03/13/2013  ?? Moderna Sars-Covid-2 Vaccination 01/27/2020, 02/05/2020  ?? Pneumococcal-Unspecified 03/06/2001, 04/19/2001, 08/16/2001, 04/18/2002  ?? Td 05/16/2011  ?? Tdap 05/16/2011, 06/06/2021  ?? Varicella 11/25/2001, 03/13/2006  ? ? ?Health Maintenance  ?Topic Date Due  ?? CHLAMYDIA SCREENING  03/17/2020  ?? COVID-19 Vaccine (3 - Booster) 04/01/2020  ?? INFLUENZA VACCINE  12/06/2021  ?? TETANUS/TDAP  06/07/2031  ?? HPV VACCINES  Completed  ?? Hepatitis C Screening  Completed  ?? HIV Screening  Completed  ? ? ?Discussed health benefits of physical activity, and encouraged her to engage in regular exercise appropriate for her age and condition. ? ?Problem List Items Addressed  This Visit   ? ?  ? Endocrine  ? Postsurgical hypothyroidism  ?  Up and down TSH ?Recheck today ?May need to consider qod 150 and 175 dosing of synthroid ? ?  ?  ? Relevant Orders  ? TSH + free T4  ?  ? Other

## 2021-08-22 ENCOUNTER — Other Ambulatory Visit (HOSPITAL_COMMUNITY)
Admission: RE | Admit: 2021-08-22 | Discharge: 2021-08-22 | Disposition: A | Discharge status: Home / Self Care | Payer: BC Managed Care – PPO | Source: Ambulatory Visit | Attending: Family Medicine | Admitting: Family Medicine

## 2021-08-22 ENCOUNTER — Encounter: Payer: Self-pay | Admitting: Family Medicine

## 2021-08-22 ENCOUNTER — Ambulatory Visit: Payer: BC Managed Care – PPO | Admitting: Family Medicine

## 2021-08-22 VITALS — BP 122/89 | HR 70 | Temp 98.4°F | Resp 16 | Ht 70.0 in | Wt 241.8 lb

## 2021-08-22 DIAGNOSIS — E890 Postprocedural hypothyroidism: Secondary | ICD-10-CM | POA: Diagnosis not present

## 2021-08-22 DIAGNOSIS — Z113 Encounter for screening for infections with a predominantly sexual mode of transmission: Secondary | ICD-10-CM | POA: Insufficient documentation

## 2021-08-22 DIAGNOSIS — Z0000 Encounter for general adult medical examination without abnormal findings: Secondary | ICD-10-CM | POA: Diagnosis not present

## 2021-08-22 DIAGNOSIS — F909 Attention-deficit hyperactivity disorder, unspecified type: Secondary | ICD-10-CM

## 2021-08-22 LAB — POCT URINALYSIS DIPSTICK
Bilirubin, UA: NEGATIVE
Blood, UA: NEGATIVE
Glucose, UA: NEGATIVE
Ketones, UA: NEGATIVE
Leukocytes, UA: NEGATIVE
Nitrite, UA: NEGATIVE
Protein, UA: NEGATIVE
Spec Grav, UA: 1.015 (ref 1.010–1.025)
Urobilinogen, UA: 0.2 E.U./dL
pH, UA: 6 (ref 5.0–8.0)

## 2021-08-22 NOTE — Assessment & Plan Note (Signed)
Up and down TSH ?Recheck today ?May need to consider qod 150 and 175 dosing of synthroid ?

## 2021-08-22 NOTE — Assessment & Plan Note (Signed)
F/b [facility] ?Well controlled on vyvanse ?

## 2021-08-23 ENCOUNTER — Telehealth: Payer: Self-pay | Admitting: Family Medicine

## 2021-08-23 ENCOUNTER — Telehealth: Payer: Self-pay

## 2021-08-23 DIAGNOSIS — E890 Postprocedural hypothyroidism: Secondary | ICD-10-CM

## 2021-08-23 LAB — TSH+FREE T4
Free T4: 1.48 ng/dL (ref 0.82–1.77)
TSH: 11.4 u[IU]/mL — ABNORMAL HIGH (ref 0.450–4.500)

## 2021-08-23 LAB — URINE CYTOLOGY ANCILLARY ONLY
Chlamydia: NEGATIVE
Comment: NEGATIVE
Comment: NORMAL
Neisseria Gonorrhea: NEGATIVE

## 2021-08-23 LAB — CBC
Hematocrit: 41.1 % (ref 34.0–46.6)
Hemoglobin: 13.6 g/dL (ref 11.1–15.9)
MCH: 27.5 pg (ref 26.6–33.0)
MCHC: 33.1 g/dL (ref 31.5–35.7)
MCV: 83 fL (ref 79–97)
Platelets: 340 10*3/uL (ref 150–450)
RBC: 4.95 x10E6/uL (ref 3.77–5.28)
RDW: 13.6 % (ref 11.7–15.4)
WBC: 7.7 10*3/uL (ref 3.4–10.8)

## 2021-08-23 MED ORDER — LEVOTHYROXINE SODIUM 175 MCG PO TABS: 175.0000 ug | ORAL_TABLET | Freq: Every day | ORAL | 1 refills | Status: DC

## 2021-08-23 NOTE — Telephone Encounter (Signed)
Copied from CRM [id number]. 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  She gave to Dr B yesterday at appt ?

## 2021-08-23 NOTE — Telephone Encounter (Signed)
Pt states Dr B wanted to increase her  ?levothyroxine (SYNTHROID) 150 MCG tablet ? ?To levothyroxine (SYNTHROID) 175 MCG tablet ? ?Pt wants to know when this will be called in. ? ? ?[id number] [address], [place] ?

## 2021-08-24 NOTE — Telephone Encounter (Signed)
Patient advised forms are ready for her to pick up at front desk. ?

## 2021-10-17 ENCOUNTER — Telehealth: Payer: Self-pay | Admitting: Family Medicine

## 2021-10-17 DIAGNOSIS — Z111 Encounter for screening for respiratory tuberculosis: Secondary | ICD-10-CM

## 2021-10-17 NOTE — Telephone Encounter (Signed)
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 Please advise CB- [phone]

## 2021-10-18 NOTE — Telephone Encounter (Signed)
Patient aware lab has been placed.

## 2021-10-18 NOTE — Telephone Encounter (Signed)
Yes please order Shabria Egley Burke for TB screening. Thanks!

## 2021-11-03 LAB — QUANTIFERON-TB GOLD PLUS
QuantiFERON Mitogen Value: >10 IU/mL
QuantiFERON Nil Value: 0.07 IU/mL
QuantiFERON TB1 Ag Value: 0.06 IU/mL
QuantiFERON TB2 Ag Value: 0.07 IU/mL
QuantiFERON-TB Gold Plus: NEGATIVE

## 2021-11-10 ENCOUNTER — Telehealth: Payer: Self-pay

## 2021-11-10 NOTE — Telephone Encounter (Signed)
Copied from CRM [id number]. 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 Pt requests call back once the tb test results are available for pick up. Cb# [phone]

## 2021-11-10 NOTE — Telephone Encounter (Signed)
Patient advised that lab printed and placed up front.

## 2021-11-29 ENCOUNTER — Other Ambulatory Visit (HOSPITAL_COMMUNITY)
Admission: RE | Admit: 2021-11-29 | Discharge: 2021-11-29 | Disposition: A | Discharge status: Home / Self Care | Payer: BC Managed Care – PPO | Source: Ambulatory Visit | Attending: Advanced Practice Midwife | Admitting: Advanced Practice Midwife

## 2021-11-29 ENCOUNTER — Encounter: Payer: Self-pay | Admitting: Advanced Practice Midwife

## 2021-11-29 ENCOUNTER — Telehealth: Payer: Self-pay

## 2021-11-29 ENCOUNTER — Ambulatory Visit: Payer: BC Managed Care – PPO | Admitting: Advanced Practice Midwife

## 2021-11-29 VITALS — BP 120/80 | Ht 72.0 in | Wt 236.0 lb

## 2021-11-29 DIAGNOSIS — N9089 Other specified noninflammatory disorders of vulva and perineum: Secondary | ICD-10-CM

## 2021-11-29 NOTE — Progress Notes (Signed)
Patient ID: Kristen Davis, female   DOB: 11/24/2000, 21 y.o.   MRN: [id number]  Reason for Consult: Vaginal Pain   Subjective:  HPI:  Kristen Davis is a 21 y.o. female being seen for external vaginal pain. She had yeast symptoms a few weeks ago and treated with 3 day monistat. The itching stopped. About 2 weeks ago she had intercourse with stinging/burning pain especially above the clitoris and worse stinging with urination. She denies odor, discharge or itching currently. She does not have concerns for STDs.   Past Medical History:  Diagnosis Date   Allergy    Asthma    Heart murmur    Hyperthyroidism    Family History  Problem Relation Age of Onset   ADD / ADHD Sister    Diabetes Father    Past Surgical History:  Procedure Laterality Date   NO PAST SURGERIES     TOTAL THYROIDECTOMY     tubes in ears Bilateral 2004    Short Social History:  Social History   Tobacco Use   Smoking status: Never   Smokeless tobacco: Never  Substance Use Topics   Alcohol use: No    No Known Allergies  Current Outpatient Medications  Medication Sig Dispense Refill   albuterol (VENTOLIN HFA) 108 (90 Base) MCG/ACT inhaler Inhale into the lungs.     hydrOXYzine (VISTARIL) 100 MG capsule Take 1 capsule (100 mg total) by mouth 3 (three) times daily as needed (for anxiety). 15 capsule 1   Levonorgestrel 19.5 MG IUD 19.5 mg by Intrauterine route once.     levothyroxine (SYNTHROID) 175 MCG tablet Take 1 tablet (175 mcg total) by mouth daily. 90 tablet 1   spironolactone (ALDACTONE) 50 MG tablet      tretinoin (RETIN-A) 0.05 % cream      triamcinolone cream (KENALOG) 0.1 % APPLY TO AFFECTED AREA TWICE A DAY 30 g 0   VYVANSE 60 MG capsule Take 60 mg by mouth daily.     levothyroxine (SYNTHROID) 25 MCG tablet      minocycline (MINOCIN) 100 MG capsule Take 100 mg by mouth 2 (two) times daily.     No current facility-administered medications for this visit.    Review of Systems   Constitutional:  Negative for chills and fever.  HENT:  Negative for congestion, ear discharge, ear pain, hearing loss, sinus pain and sore throat.   Eyes:  Negative for blurred vision and double vision.  Respiratory:  Negative for cough, shortness of breath and wheezing.   Cardiovascular:  Negative for chest pain, palpitations and leg swelling.  Gastrointestinal:  Negative for abdominal pain, blood in stool, constipation, diarrhea, heartburn, melena, nausea and vomiting.  Genitourinary:  Negative for dysuria, flank pain, frequency, hematuria and urgency.       Positive for external vaginal pain  Musculoskeletal:  Negative for back pain, joint pain and myalgias.  Skin:  Negative for itching and rash.  Neurological:  Negative for dizziness, tingling, tremors, sensory change, speech change, focal weakness, seizures, loss of consciousness, weakness and headaches.  Endo/Heme/Allergies:  Negative for environmental allergies. Does not bruise/bleed easily.  Psychiatric/Behavioral:  Negative for depression, hallucinations, memory loss, substance abuse and suicidal ideas. The patient is not nervous/anxious and does not have insomnia.         Objective:  Objective   Vitals:   11/29/21 1632  BP: 120/80  Weight: 236 lb (107 kg)  Height: 6' (1.829 m)   Body mass index is 32.01 kg/m.  Constitutional: Well nourished, well developed female in no acute distress.  HEENT: normal Skin: Warm and dry.  Extremity:  no edema   Respiratory:  Normal respiratory effort Neuro: DTRs 2+, Cranial nerves grossly intact Psych: Alert and Oriented x3. No memory deficits. Normal mood and affect.    Pelvic exam: is not limited by body habitus EGBUS: 3 cm long pencil-tip thin abrasion right side clitoral hood fold Vagina: within normal limits and with normal mucosa, some tenderness to touch at introitus   Assessment/Plan:     21 y.o. G0 P0 female with vulvar abrasion  Aptima: vaginitis Follow up as  needed after lab results Sea salt soaks   Tresea Mall CNM [facility] 11/29/2021, 5:06 PM

## 2021-11-29 NOTE — Telephone Encounter (Signed)
Patient contacted office with complaints of yeast infection like symptoms for 2 weeks. Patient states that two weeks ago she began having symptoms of vaginal itching external/internal and states that she treated herself with otc Monistat for 3 day treatment. Patient states that itching did improve but states that she noticed on multiple occasions during intercourse it would be painful and patient describes vulva area as burning. Patient denied any symptoms of vaginal itching, discharge, bleeding or concerns of STD. I advised patient schedule office visit for further evaluation and to discuss treatment. KW

## 2021-11-29 NOTE — Telephone Encounter (Signed)
Opened in error. KW °

## 2021-12-01 ENCOUNTER — Other Ambulatory Visit: Payer: Self-pay | Admitting: Advanced Practice Midwife

## 2021-12-01 DIAGNOSIS — B3731 Acute candidiasis of vulva and vagina: Secondary | ICD-10-CM

## 2021-12-01 LAB — CERVICOVAGINAL ANCILLARY ONLY
Bacterial Vaginitis (gardnerella): NEGATIVE
Candida Glabrata: NEGATIVE
Candida Vaginitis: POSITIVE — AB
Comment: NEGATIVE
Comment: NEGATIVE
Comment: NEGATIVE

## 2021-12-01 MED ORDER — FLUCONAZOLE 150 MG PO TABS: 150.0000 mg | ORAL_TABLET | Freq: Once | ORAL | 1 refills | Status: AC

## 2021-12-01 NOTE — Progress Notes (Signed)
Rx diflucan sent to treat yeast infection. Message sent to patient. 

## 2021-12-05 ENCOUNTER — Ambulatory Visit: Payer: BC Managed Care – PPO | Admitting: Family Medicine

## 2022-02-16 ENCOUNTER — Other Ambulatory Visit: Payer: Self-pay | Admitting: Family Medicine

## 2022-02-16 DIAGNOSIS — E890 Postprocedural hypothyroidism: Secondary | ICD-10-CM

## 2022-02-22 NOTE — Progress Notes (Signed)
I,Sulibeya S Dimas,acting as a scribe for Lavon Paganini, MD.,have documented all relevant documentation on the behalf of Lavon Paganini, MD,as directed by  Lavon Paganini, MD while in the presence of Lavon Paganini, MD.     Established patient visit   Patient: YACINE DROZ   DOB: [birth year]   21 y.o. Female  MRN: [id number] Visit Date: 02/23/2022  Today's healthcare provider: Lavon Paganini, MD   Chief Complaint  Patient presents with   Anxiety   Subjective    HPI  Anxiety, Follow-up  She was last seen for anxiety 2 years ago. Changes made at last visit include no change.   She reports she has been off hydroxyzine for about 2 years. Gets some chest tightness and has some panic symptoms.  Not an every day problem.  She feels her anxiety is moderate and Worse since last visit.  Symptoms: Yes chest pain Yes difficulty concentrating  No dizziness Yes fatigue  Yes feelings of losing control Yes insomnia  Yes irritable Yes palpitations  No panic attacks Yes racing thoughts  No shortness of breath Yes sweating  Yes tremors/shakes    GAD-7 Results    02/23/2022    3:48 PM  GAD-7 Generalized Anxiety Disorder Screening Tool  1. Feeling Nervous, Anxious, or on Edge 1  2. Not Being Able to Stop or Control Worrying 1  3. Worrying Too Much About Different Things 1  4. Trouble Relaxing 2  5. Being So Restless it's Hard To Sit Still 3  6. Becoming Easily Annoyed or Irritable 2  7. Feeling Afraid As If Something Awful Might Happen 0  Total GAD-7 Score 10  Difficulty At Work, Home, or Getting  Along With Others? Somewhat difficult    PHQ-9 Scores    02/23/2022    3:51 PM 08/22/2021    9:35 AM 06/06/2021    3:13 PM  PHQ9 SCORE ONLY  PHQ-9 Total Score 3 3 0   --------------------------------------------------------------------------------------------------- Patient C/O joint pain in hands, elbows and knees x several years. She reports pain has been  worsening in the last few months. Patient denies any swelling, rash or redness on joints. Patient reports taking OTC medication for pain Ibuprofen helps with pain.   Medications: Outpatient Medications Prior to Visit  Medication Sig   albuterol (VENTOLIN HFA) 108 (90 Base) MCG/ACT inhaler Inhale into the lungs.   Levonorgestrel 19.5 MG IUD 19.5 mg by Intrauterine route once.   lisdexamfetamine (VYVANSE) 20 MG capsule Take 20 mg by mouth daily in the afternoon.   lisdexamfetamine (VYVANSE) 40 MG capsule Take 40 mg by mouth every morning.   spironolactone (ALDACTONE) 50 MG tablet    tretinoin (RETIN-A) 0.05 % cream    [DISCONTINUED] levothyroxine (SYNTHROID) 175 MCG tablet TAKE 1 TABLET BY MOUTH EVERY DAY   [DISCONTINUED] levothyroxine (SYNTHROID) 25 MCG tablet    [DISCONTINUED] hydrOXYzine (VISTARIL) 100 MG capsule Take 1 capsule (100 mg total) by mouth 3 (three) times daily as needed (for anxiety). (Patient not taking: Reported on 02/23/2022)   [DISCONTINUED] minocycline (MINOCIN) 100 MG capsule Take 100 mg by mouth 2 (two) times daily. (Patient not taking: Reported on 02/23/2022)   [DISCONTINUED] triamcinolone cream (KENALOG) 0.1 % APPLY TO AFFECTED AREA TWICE A DAY (Patient not taking: Reported on 02/23/2022)   [DISCONTINUED] VYVANSE 60 MG capsule Take 60 mg by mouth daily. (Patient not taking: Reported on 02/23/2022)   No facility-administered medications prior to visit.    Review of Systems per HPI  Objective    BP 111/76 (BP Location: Left Arm, Patient Position: Sitting, Cuff Size: Large)   Temp 98.1 F (36.7 C) (Oral)   Resp 16   Ht 6' (1.829 m)   Wt 226 lb (102.5 kg)   BMI 30.65 kg/m  BP Readings from Last 3 Encounters:  02/23/22 111/76  11/29/21 120/80  08/22/21 122/89   Wt Readings from Last 3 Encounters:  02/23/22 226 lb (102.5 kg)  11/29/21 236 lb (107 kg)  08/22/21 241 lb 12.8 oz (109.7 kg)      Physical Exam Vitals reviewed.  Constitutional:       General: She is not in acute distress.    Appearance: Normal appearance. She is well-developed. She is not diaphoretic.  HENT:     Head: Normocephalic and atraumatic.  Eyes:     General: No scleral icterus.    Conjunctiva/sclera: Conjunctivae normal.  Neck:     Thyroid: No thyromegaly.  Cardiovascular:     Rate and Rhythm: Normal rate and regular rhythm.     Pulses: Normal pulses.     Heart sounds: Normal heart sounds. No murmur heard. Pulmonary:     Effort: Pulmonary effort is normal. No respiratory distress.     Breath sounds: Normal breath sounds. No wheezing, rhonchi or rales.  Musculoskeletal:     Cervical back: Neck supple.     Right lower leg: No edema.     Left lower leg: No edema.  Lymphadenopathy:     Cervical: No cervical adenopathy.  Skin:    General: Skin is warm and dry.     Findings: No rash.  Neurological:     Mental Status: She is alert and oriented to person, place, and time. Mental status is at baseline.  Psychiatric:        Mood and Affect: Mood normal.        Behavior: Behavior normal.       Results for orders placed or performed in visit on 02/23/22  TSH  Result Value Ref Range   TSH 0.174 (L) 0.450 - 4.500 uIU/mL  Rheumatoid Factor  Result Value Ref Range   Rhuematoid fact SerPl-aCnc <10.0 <14.0 IU/mL  ANA Direct w/Reflex if Positive  Result Value Ref Range   Anti Nuclear Antibody (ANA) Negative Negative  Sed Rate (ESR)  Result Value Ref Range   Sed Rate 11 0 - 32 mm/hr  C-reactive protein  Result Value Ref Range   CRP 3 0 - 10 mg/L  CYCLIC CITRUL PEPTIDE ANTIBODY, IGG/IGA  Result Value Ref Range   Cyclic Citrullin Peptide Ab WILL FOLLOW     Assessment & Plan     Problem List Items Addressed This Visit       Endocrine   Postsurgical hypothyroidism - Primary    TSH has been up and down Recheck today If low, consider qod dosing of 150 and 175 mcg dose of synthroid      Relevant Orders   TSH (Completed)     Other   Anxiety     Intermittent issue Not generalized, but happens more in stressful situations She did well with hydroxyzine as needed previously and she does not want a daily medication at this time Resume hydroxyzine as needed      Relevant Medications   hydrOXYzine (VISTARIL) 100 MG capsule   Polyarthralgia    Longstanding intermittent issue of polyarthralgia, primarily in the hands, wrists, elbows, knees, low back Associated with joint stiffness Denies any frank erythema or swelling This  does seem out of the normal for an average 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, 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 It does seem to affect multiple joints and she does have AM joint stiffness Referral to rheumatology for further evaluation We will start work-up with rheumatoid factor, anti-CCP antibody, ANA, ESR, CRP Treat with as needed NSAIDs for now      Relevant Orders   Ambulatory referral to Rheumatology   Rheumatoid Factor (Completed)   ANA Direct w/Reflex if Positive (Completed)   Sed Rate (ESR) (Completed)   C-reactive protein (Completed)   CYCLIC CITRUL PEPTIDE ANTIBODY, IGG/IGA (Completed)   Other Visit Diagnoses     Joint stiffness       Relevant Orders   Ambulatory referral to Rheumatology   Rheumatoid Factor (Completed)   ANA Direct w/Reflex if Positive (Completed)   Sed Rate (ESR) (Completed)   C-reactive protein (Completed)   CYCLIC CITRUL PEPTIDE ANTIBODY, IGG/IGA (Completed)        Return in about 6 months (around 08/25/2022) for CPE.      I, Lavon Paganini, MD, have reviewed all documentation for this visit. The documentation on 02/24/22 for the exam, diagnosis, procedures, and orders are all accurate and complete.   Arya Luttrull, Dionne Bucy, MD, MPH [facility]

## 2022-02-23 ENCOUNTER — Ambulatory Visit: Payer: BC Managed Care – PPO | Admitting: Family Medicine

## 2022-02-23 ENCOUNTER — Encounter: Payer: Self-pay | Admitting: Family Medicine

## 2022-02-23 VITALS — BP 111/76 | Temp 98.1°F | Resp 16 | Ht 72.0 in | Wt 226.0 lb

## 2022-02-23 DIAGNOSIS — F419 Anxiety disorder, unspecified: Secondary | ICD-10-CM

## 2022-02-23 DIAGNOSIS — E890 Postprocedural hypothyroidism: Secondary | ICD-10-CM | POA: Diagnosis not present

## 2022-02-23 DIAGNOSIS — M2550 Pain in unspecified joint: Secondary | ICD-10-CM

## 2022-02-23 DIAGNOSIS — M2560 Stiffness of unspecified joint, not elsewhere classified: Secondary | ICD-10-CM | POA: Diagnosis not present

## 2022-02-23 MED ORDER — HYDROXYZINE PAMOATE 100 MG PO CAPS: 100.0000 mg | ORAL_CAPSULE | Freq: Three times a day (TID) | ORAL | 1 refills | Status: DC | PRN

## 2022-02-24 ENCOUNTER — Other Ambulatory Visit: Payer: Self-pay

## 2022-02-24 DIAGNOSIS — F419 Anxiety disorder, unspecified: Secondary | ICD-10-CM | POA: Insufficient documentation

## 2022-02-24 DIAGNOSIS — E890 Postprocedural hypothyroidism: Secondary | ICD-10-CM

## 2022-02-24 DIAGNOSIS — M2550 Pain in unspecified joint: Secondary | ICD-10-CM | POA: Insufficient documentation

## 2022-02-24 MED ORDER — LEVOTHYROXINE SODIUM 175 MCG PO TABS: ORAL_TABLET | ORAL | 0 refills | Status: DC

## 2022-02-24 MED ORDER — LEVOTHYROXINE SODIUM 150 MCG PO TABS: ORAL_TABLET | ORAL | 3 refills | Status: DC

## 2022-02-24 NOTE — Assessment & Plan Note (Signed)
TSH has been up and down Recheck today If low, consider qod dosing of 150 and 175 mcg dose of synthroid

## 2022-02-24 NOTE — Assessment & Plan Note (Signed)
Intermittent issue Not generalized, but happens more in stressful situations She did well with hydroxyzine as needed previously and she does not want a daily medication at this time Resume hydroxyzine as needed

## 2022-02-24 NOTE — Assessment & Plan Note (Addendum)
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

## 2022-02-25 LAB — TSH: TSH: 0.174 u[IU]/mL — ABNORMAL LOW (ref 0.450–4.500)

## 2022-02-25 LAB — SEDIMENTATION RATE: Sed Rate: 11 mm/hr (ref 0–32)

## 2022-02-25 LAB — RHEUMATOID FACTOR: Rheumatoid fact SerPl-aCnc: <10 IU/mL (ref <14.0)

## 2022-02-25 LAB — C-REACTIVE PROTEIN: CRP: 3 mg/L (ref 0–10)

## 2022-02-25 LAB — CYCLIC CITRUL PEPTIDE ANTIBODY, IGG/IGA: Cyclic Citrullin Peptide Ab: <1 units (ref 0–19)

## 2022-02-25 LAB — ANA W/REFLEX IF POSITIVE: Anti Nuclear Antibody (ANA): NEGATIVE

## 2022-04-20 ENCOUNTER — Other Ambulatory Visit: Payer: Self-pay

## 2022-04-20 MED ORDER — LISDEXAMFETAMINE DIMESYLATE 40 MG PO CAPS
ORAL_CAPSULE | ORAL | 0 refills | Status: DC
  Filled 2022-04-20: qty 30, 30d supply, fill #0

## 2022-05-19 ENCOUNTER — Other Ambulatory Visit: Payer: Self-pay

## 2022-05-19 MED ORDER — LISDEXAMFETAMINE DIMESYLATE 40 MG PO CAPS
40.0000 mg | ORAL_CAPSULE | Freq: Every day | ORAL | 0 refills | Status: DC
  Filled 2022-05-19: qty 30, 30d supply, fill #0

## 2022-05-19 MED ORDER — LISDEXAMFETAMINE DIMESYLATE 20 MG PO CAPS
20.0000 mg | ORAL_CAPSULE | Freq: Every day | ORAL | 0 refills | Status: DC
  Filled 2022-05-19: qty 13, 13d supply, fill #0
  Filled 2022-05-19: qty 17, 17d supply, fill #0

## 2022-05-22 ENCOUNTER — Other Ambulatory Visit: Payer: Self-pay

## 2022-06-26 ENCOUNTER — Other Ambulatory Visit: Payer: Self-pay

## 2022-06-26 MED ORDER — LISDEXAMFETAMINE DIMESYLATE 40 MG PO CAPS: 40.0000 mg | ORAL_CAPSULE | Freq: Every day | ORAL | 0 refills | Status: DC

## 2022-08-22 NOTE — Progress Notes (Unsigned)
I,Vianey Caniglia S Jazmen Lindenbaum,acting as a Neurosurgeon for Shirlee Latch, MD.,have documented all relevant documentation on the behalf of Shirlee Latch, MD,as directed by  Shirlee Latch, MD while in the presence of Shirlee Latch, MD.    Complete physical exam   Patient: Kristen Davis   DOB: 2000/06/18   22 y.o. Female  MRN: [id number] Visit Date: 08/24/2022  Today's healthcare provider: Shirlee Latch, MD   No chief complaint on file.  Subjective    Kristen Davis is a 22 y.o. female who presents today for a complete physical exam.  She reports consuming a {diet types:17450} diet. {Exercise:19826} She generally feels {well/fairly well/poorly:18703}. She reports sleeping {well/fairly well/poorly:18703}. She {does/does not:200015} have additional problems to discuss today.  HPI  ***  Past Medical History:  Diagnosis Date   Allergy    Asthma    Heart murmur    Hyperthyroidism    Past Surgical History:  Procedure Laterality Date   NO PAST SURGERIES     TOTAL THYROIDECTOMY     tubes in ears Bilateral 2004   Social History   Socioeconomic History   Marital status: Single    Spouse name: Not on file   Number of children: Not on file   Years of education: Not on file   Highest education level: Not on file  Occupational History   Not on file  Tobacco Use   Smoking status: Never   Smokeless tobacco: Never  Vaping Use   Vaping Use: Never used  Substance and Sexual Activity   Alcohol use: No   Drug use: No   Sexual activity: Yes    Birth control/protection: I.U.D.    Comment: Kyleena  Other Topics Concern   Not on file  Social History Narrative   Lives at home with mom, dad and sister     Attends [employer].    Social Determinants of Health   Financial Resource Strain: Not on file  Food Insecurity: Not on file  Transportation Needs: Not on file  Physical Activity: Not on file  Stress: Not on file  Social Connections: Not on file  Intimate Partner Violence:  Not on file   Family Status  Relation Name Status   Sister  Alive   Father  Alive   Mother  Alive   MGM  Alive   MGF  Deceased   PGM  Alive   PGF  Alive   Family History  Problem Relation Age of Onset   ADD / ADHD Sister    Diabetes Father    No Known Allergies  Patient Care Team: Bacigalupo, Marzella Schlein, MD as PCP - General (Family Medicine)   Medications: Outpatient Medications Prior to Visit  Medication Sig   albuterol (VENTOLIN HFA) 108 (90 Base) MCG/ACT inhaler Inhale into the lungs.   hydrOXYzine (VISTARIL) 100 MG capsule Take 1 capsule (100 mg total) by mouth 3 (three) times daily as needed (for anxiety).   Levonorgestrel 19.5 MG IUD 19.5 mg by Intrauterine route once.   levothyroxine (SYNTHROID) 150 MCG tablet Alternate every other day with 175 mcg pills   levothyroxine (SYNTHROID) 175 MCG tablet Alternate every other day with 150 mcg   lisdexamfetamine (VYVANSE) 20 MG capsule Take 20 mg by mouth daily in the afternoon.   lisdexamfetamine (VYVANSE) 20 MG capsule Take 1 capsule (20 mg total) by mouth daily in the afternoon.   lisdexamfetamine (VYVANSE) 40 MG capsule Take 40 mg by mouth every morning.   lisdexamfetamine (VYVANSE) 40 MG capsule  Take 1 capsule (40 mg total) by mouth daily.   spironolactone (ALDACTONE) 50 MG tablet    tretinoin (RETIN-A) 0.05 % cream    No facility-administered medications prior to visit.    Review of Systems  All other systems reviewed and are negative.   Last CBC Lab Results  Component Value Date   WBC 7.7 08/22/2021   HGB 13.6 08/22/2021   HCT 41.1 08/22/2021   MCV 83 08/22/2021   MCH 27.5 08/22/2021   RDW 13.6 08/22/2021   PLT 340 08/22/2021   Last metabolic panel Lab Results  Component Value Date   GLUCOSE 93 06/06/2021   NA 141 06/06/2021   K 4.0 06/06/2021   CL 102 06/06/2021   CO2 25 06/06/2021   BUN 17 06/06/2021   CREATININE 0.91 06/06/2021   EGFR 93 06/06/2021   CALCIUM 9.5 06/06/2021   PROT 7.0 06/06/2021    ALBUMIN 4.7 06/06/2021   LABGLOB 2.3 06/06/2021   AGRATIO 2.0 06/06/2021   BILITOT 0.5 06/06/2021   ALKPHOS 68 06/06/2021   AST 12 06/06/2021   ALT 9 06/06/2021   Last lipids Lab Results  Component Value Date   CHOL 142 06/06/2021   HDL 42 06/06/2021   LDLCALC 75 06/06/2021   TRIG 145 06/06/2021   CHOLHDL 3.4 06/06/2021   Last hemoglobin A1c Lab Results  Component Value Date   HGBA1C 5.1 01/17/2018   Last thyroid functions Lab Results  Component Value Date   TSH 0.174 (L) 02/23/2022   T3TOTAL 74 02/03/2019   T4TOTAL 6.5 10/10/2018      Objective    There were no vitals taken for this visit. BP Readings from Last 3 Encounters:  02/23/22 111/76  11/29/21 120/80  08/22/21 122/89   Wt Readings from Last 3 Encounters:  02/23/22 226 lb (102.5 kg)  11/29/21 236 lb (107 kg)  08/22/21 241 lb 12.8 oz (109.7 kg)       Physical Exam  ***  Last depression screening scores    02/23/2022    3:51 PM 08/22/2021    9:35 AM 06/06/2021    3:13 PM  PHQ 2/9 Scores  PHQ - 2 Score 1 0 0  PHQ- 9 Score 3 3    Last fall risk screening    02/23/2022    3:50 PM  Fall Risk   Falls in the past year? 0  Number falls in past yr: 0  Injury with Fall? 0  Risk for fall due to : No Fall Risks  Follow up Falls evaluation completed   Last Audit-C alcohol use screening    02/23/2022    3:51 PM  Alcohol Use Disorder Test (AUDIT)  1. How often do you have a drink containing alcohol? 2  2. How many drinks containing alcohol do you have on a typical day when you are drinking? 0  3. How often do you have six or more drinks on one occasion? 0  AUDIT-C Score 2   A score of 3 or more in women, and 4 or more in men indicates increased risk for alcohol abuse, EXCEPT if all of the points are from question 1   No results found for any visits on 08/24/22.  Assessment & Plan    Routine Health Maintenance and Physical Exam  Exercise Activities and Dietary recommendations  Goals    None     Immunization History  Administered Date(s) Administered   DTaP 01/17/2001, 03/06/2001, 04/19/2001, 04/18/2002, 01/27/2005   HIB (PRP-OMP) 01/17/2001, 03/06/2001, 04/19/2001,  11/25/2001   HPV 9-valent 12/03/2014   HPV Quadrivalent 06/03/2012, 03/13/2013, 12/03/2014   Hepatitis A 03/13/2006, 03/27/2007   Hepatitis B Apr 29, 2001, 08/16/2001   IPV 01/17/2001, 03/06/2001, 04/18/2002, 01/27/2005   Influenza,inj,Quad PF,6+ Mos 03/13/2013, 03/17/2014, 01/22/2018   Influenza-Unspecified 03/17/2014, 03/16/2021, 02/10/2022   MMR 11/25/2001, 01/27/2005   Meningococcal Conjugate 03/13/2013   Moderna Sars-Covid-2 Vaccination 01/27/2020, 02/05/2020   Pneumococcal-Unspecified 03/06/2001, 04/19/2001, 08/16/2001, 04/18/2002   Td 05/16/2011   Tdap 05/16/2011, 06/06/2021   Varicella 11/25/2001, 03/13/2006    Health Maintenance  Topic Date Due   PAP-Cervical Cytology Screening  Never done   PAP SMEAR-Modifier  Never done   COVID-19 Vaccine (3 - 2023-24 season) 01/06/2022   CHLAMYDIA SCREENING  11/30/2022   INFLUENZA VACCINE  12/07/2022   DTaP/Tdap/Td (9 - Td or Tdap) 06/07/2031   HPV VACCINES  Completed   Hepatitis C Screening  Completed   HIV Screening  Completed    Discussed health benefits of physical activity, and encouraged her to engage in regular exercise appropriate for her age and condition.  ***  No follow-ups on file.     {provider attestation***:1}   Shirlee Latch, MD  [facility] [phone] (phone) [phone] (fax)  [facility]

## 2022-08-24 ENCOUNTER — Encounter: Payer: Self-pay | Admitting: Family Medicine

## 2022-08-24 ENCOUNTER — Other Ambulatory Visit (HOSPITAL_COMMUNITY)
Admission: RE | Admit: 2022-08-24 | Discharge: 2022-08-24 | Disposition: A | Discharge status: Home / Self Care | Payer: BC Managed Care – PPO | Source: Ambulatory Visit | Attending: Family Medicine | Admitting: Family Medicine

## 2022-08-24 ENCOUNTER — Ambulatory Visit (INDEPENDENT_AMBULATORY_CARE_PROVIDER_SITE_OTHER): Payer: BC Managed Care – PPO | Admitting: Family Medicine

## 2022-08-24 VITALS — BP 109/85 | HR 81 | Ht 72.0 in | Wt 226.5 lb

## 2022-08-24 DIAGNOSIS — Z124 Encounter for screening for malignant neoplasm of cervix: Secondary | ICD-10-CM | POA: Diagnosis not present

## 2022-08-24 DIAGNOSIS — Z0000 Encounter for general adult medical examination without abnormal findings: Secondary | ICD-10-CM | POA: Insufficient documentation

## 2022-08-24 DIAGNOSIS — E890 Postprocedural hypothyroidism: Secondary | ICD-10-CM

## 2022-08-24 DIAGNOSIS — Z13220 Encounter for screening for lipoid disorders: Secondary | ICD-10-CM | POA: Diagnosis not present

## 2022-08-24 NOTE — Assessment & Plan Note (Signed)
Previously uncontrolled Continue synthroid at current dose - alternating 150 and 175 mcg qod Recheck TSH and adjust Synthroid as indicated

## 2022-08-25 LAB — COMPREHENSIVE METABOLIC PANEL
ALT: 14 IU/L (ref 0–32)
AST: 13 IU/L (ref 0–40)
Albumin/Globulin Ratio: 2 (ref 1.2–2.2)
Albumin: 4.9 g/dL (ref 4.0–5.0)
Alkaline Phosphatase: 70 IU/L (ref 44–121)
BUN/Creatinine Ratio: 20 (ref 9–23)
BUN: 17 mg/dL (ref 6–20)
Bilirubin Total: 0.3 mg/dL (ref 0.0–1.2)
CO2: 19 mmol/L — ABNORMAL LOW (ref 20–29)
Calcium: 9.7 mg/dL (ref 8.7–10.2)
Chloride: 102 mmol/L (ref 96–106)
Creatinine, Ser: 0.86 mg/dL (ref 0.57–1.00)
Globulin, Total: 2.5 g/dL (ref 1.5–4.5)
Glucose: 96 mg/dL (ref 70–99)
Potassium: 4.5 mmol/L (ref 3.5–5.2)
Sodium: 139 mmol/L (ref 134–144)
Total Protein: 7.4 g/dL (ref 6.0–8.5)
eGFR: 99 mL/min/{1.73_m2} (ref >59)

## 2022-08-25 LAB — LIPID PANEL
Chol/HDL Ratio: 3.5 ratio (ref 0.0–4.4)
Cholesterol, Total: 157 mg/dL (ref 100–199)
HDL: 45 mg/dL (ref >39)
LDL Chol Calc (NIH): 97 mg/dL (ref 0–99)
Triglycerides: 79 mg/dL (ref 0–149)
VLDL Cholesterol Cal: 15 mg/dL (ref 5–40)

## 2022-08-25 LAB — TSH: TSH: 0.575 u[IU]/mL (ref 0.450–4.500)

## 2022-09-01 LAB — CYTOLOGY - PAP
Chlamydia: NEGATIVE
Comment: NEGATIVE
Comment: NORMAL
Diagnosis: NEGATIVE
Neisseria Gonorrhea: NEGATIVE

## 2022-09-04 ENCOUNTER — Telehealth: Payer: Self-pay

## 2022-09-04 MED ORDER — FLUCONAZOLE 150 MG PO TABS: 150.0000 mg | ORAL_TABLET | Freq: Once | ORAL | 0 refills | Status: AC

## 2022-09-04 NOTE — Telephone Encounter (Signed)
-----   Message from Erasmo Downer, MD sent at 09/04/2022  8:38 AM EDT ----- Normal pap smear Repeat in 3 years. Negative STD screening, but there are signs of yeast. If having discharge or itching, we will treat with fluconazole 1 pill once. Ok to send in if patient agrees.

## 2022-09-04 NOTE — Telephone Encounter (Signed)
-----   Message from Angela M Bacigalupo, MD sent at 09/04/2022  8:38 AM EDT ----- Normal pap smear Repeat in 3 years. Negative STD screening, but there are signs of yeast. If having discharge or itching, we will treat with fluconazole 1 pill once. Ok to send in if patient agrees. 

## 2022-09-12 ENCOUNTER — Encounter: Payer: Self-pay | Admitting: Family Medicine

## 2022-09-12 ENCOUNTER — Other Ambulatory Visit: Payer: Self-pay

## 2022-09-14 MED ORDER — HYDROXYZINE PAMOATE 100 MG PO CAPS: 100.0000 mg | ORAL_CAPSULE | Freq: Three times a day (TID) | ORAL | 1 refills | Status: AC | PRN

## 2022-11-10 ENCOUNTER — Encounter (INDEPENDENT_AMBULATORY_CARE_PROVIDER_SITE_OTHER): Payer: Self-pay

## 2022-11-12 ENCOUNTER — Other Ambulatory Visit: Payer: Self-pay | Admitting: Family Medicine

## 2022-11-12 DIAGNOSIS — E890 Postprocedural hypothyroidism: Secondary | ICD-10-CM

## 2023-02-26 ENCOUNTER — Ambulatory Visit: Payer: BC Managed Care – PPO | Admitting: Family Medicine

## 2023-02-26 ENCOUNTER — Encounter: Payer: Self-pay | Admitting: Family Medicine

## 2023-02-26 VITALS — BP 108/75 | HR 85 | Temp 98.9°F | Ht 72.0 in | Wt 231.0 lb

## 2023-02-26 DIAGNOSIS — E890 Postprocedural hypothyroidism: Secondary | ICD-10-CM | POA: Diagnosis not present

## 2023-02-26 DIAGNOSIS — F909 Attention-deficit hyperactivity disorder, unspecified type: Secondary | ICD-10-CM | POA: Diagnosis not present

## 2023-02-26 DIAGNOSIS — F419 Anxiety disorder, unspecified: Secondary | ICD-10-CM

## 2023-02-26 NOTE — Progress Notes (Signed)
Established Patient Office Visit  Subjective   Patient ID: Kristen Davis, female    DOB: [birth year]  Age: 22 y.o. MRN: [id number]  Chief Complaint  Patient presents with   Hypothyroidism    HPI  Discussed the use of AI scribe software for clinical note transcription with the patient, who gave verbal consent to proceed.  History of Present Illness   The patient, with a history of thyroid disease and psychiatric illness, presents for a follow-up visit. She reports good mood and sleep, and has recently switched from Adderall to Vyvanse due to a shortage. She is also on levothyroxine, alternating between 150 and 175 mcg every other day. However, she missed two days of medication due to food poisoning, which may affect her thyroid levels. The patient also has an IUD that will need to be replaced soon, as it will be 22 years old in March. She reports some spotting, which is a common reason for early replacement.         ROS    Objective:     BP 108/75   Pulse 85   Temp 98.9 F (37.2 C)   Ht 6' (1.829 m)   Wt 231 lb (104.8 kg)   SpO2 98% Comment: room air  BMI 31.33 kg/m    Physical Exam Vitals reviewed.  Constitutional:      General: She is not in acute distress.    Appearance: Normal appearance. She is well-developed. She is not diaphoretic.  HENT:     Head: Normocephalic and atraumatic.  Eyes:     General: No scleral icterus.    Conjunctiva/sclera: Conjunctivae normal.  Neck:     Thyroid: No thyromegaly.  Cardiovascular:     Rate and Rhythm: Normal rate and regular rhythm.     Heart sounds: Normal heart sounds. No murmur heard. Pulmonary:     Effort: Pulmonary effort is normal. No respiratory distress.     Breath sounds: Normal breath sounds. No wheezing, rhonchi or rales.  Musculoskeletal:     Cervical back: Neck supple.     Right lower leg: No edema.     Left lower leg: No edema.  Lymphadenopathy:     Cervical: No cervical adenopathy.  Skin:     General: Skin is warm and dry.     Findings: No rash.  Neurological:     Mental Status: She is alert and oriented to person, place, and time. Mental status is at baseline.  Psychiatric:        Mood and Affect: Mood normal.        Behavior: Behavior normal.      No results found for any visits on 02/26/23.    The ASCVD Risk score (Arnett DK, et al., 2019) failed to calculate for the following reasons:   The 2019 ASCVD risk score is only valid for ages 36 to 51    Assessment & Plan:   Problem List Items Addressed This Visit       Endocrine   Postsurgical hypothyroidism - Primary    Stable on alternating doses of Levothyroxine and . Recent episode of food poisoning led to missed doses, which may affect lab results. -Postpone thyroid function tests for a couple of weeks to allow for normalization after missed doses. -Continue current Levothyroxine regimen.      Relevant Orders   TSH     Other   ADHD    Transitioned from Adderall back to Vyvanse due to resolution of medication shortage.  No reported issues with the new medication. -Continue Vyvanse 40mg  in the morning and 20mg  in the afternoon. f/b psych      Anxiety    Intermittent issue Not generalized, but happens more in stressful situations She did well with hydroxyzine as needed previously and she does not want a daily medication at this time Resume hydroxyzine as needed           Contraception Current IUD Kristen Davis) due for replacement in March. Patient experiencing spotting, considering early replacement. -Refer to OB/GYN for IUD replacement.  General Health Maintenance / Followup Plans -Schedule physical exam in six months. -Check thyroid function tests in a couple of weeks.        Return in about 6 months (around 08/27/2023) for CPE.    Shirlee Latch, MD

## 2023-02-26 NOTE — Assessment & Plan Note (Signed)
Transitioned from Adderall back to Vyvanse due to resolution of medication shortage. No reported issues with the new medication. -Continue Vyvanse 40mg  in the morning and 20mg  in the afternoon. f/b psych

## 2023-02-26 NOTE — Assessment & Plan Note (Signed)
Intermittent issue Not generalized, but happens more in stressful situations She did well with hydroxyzine as needed previously and she does not want a daily medication at this time Resume hydroxyzine as needed

## 2023-02-26 NOTE — Assessment & Plan Note (Signed)
Stable on alternating doses of Levothyroxine and . Recent episode of food poisoning led to missed doses, which may affect lab results. -Postpone thyroid function tests for a couple of weeks to allow for normalization after missed doses. -Continue current Levothyroxine regimen.

## 2023-03-13 LAB — TSH: TSH: 1.61 u[IU]/mL (ref 0.450–4.500)

## 2023-03-23 NOTE — Progress Notes (Unsigned)
    GYNECOLOGY OFFICE PROCEDURE NOTE  Kristen Davis is a 22 y.o. G0P0000 here for [facility]  IUD removal and reinsertion. No GYN concerns.  Last pap smear was on 08/24/22 and was normal.  IUD Removal and Reinsertion  Patient identified, informed consent performed, consent signed.   Discussed risks of irregular bleeding, cramping, infection, malpositioning or misplacement of the IUD outside the uterus which may require further procedures. Also discussed >99% contraception efficacy, increased risk of ectopic pregnancy with failure of method.   Emphasized that this did not protect against STIs, condoms recommended during all sexual encounters. Advised to use backup contraception for one week as the risk of pregnancy is higher during the transition period of removing an IUD and replacing it with another one. Time out was performed. Speculum placed in the vagina. The strings of the IUD were grasped and pulled using ring forceps. The IUD was successfully removed in its entirety. The cervix was cleaned with Betadine x 2 and grasped anteriorly with a single tooth tenaculum.  The new Kyleena IUD insertion apparatus was used to sound the uterus to 7 cm;  the IUD was then placed per manufacturer's recommendations. Strings trimmed to 3 cm. Tenaculum was removed, good hemostasis noted. Patient tolerated procedure well.   Patient was given post-procedure instructions.  She was reminded to have backup contraception for one week during this transition period between IUDs.  Patient was also asked to check IUD strings periodically and follow up in 4 weeks for IUD check.   Julieanne Manson, MD [facility]

## 2023-03-26 ENCOUNTER — Ambulatory Visit: Payer: BC Managed Care – PPO | Admitting: Obstetrics

## 2023-03-26 ENCOUNTER — Encounter: Payer: Self-pay | Admitting: Obstetrics

## 2023-03-26 VITALS — BP 120/80 | Ht 72.0 in | Wt 231.0 lb

## 2023-03-26 DIAGNOSIS — Z30433 Encounter for removal and reinsertion of intrauterine contraceptive device: Secondary | ICD-10-CM

## 2023-03-26 MED ORDER — LEVONORGESTREL 19.5 MG IU IUD: INTRAUTERINE_SYSTEM | Freq: Once | INTRAUTERINE | Status: AC

## 2023-03-26 NOTE — Patient Instructions (Addendum)
   IUD AFTERCARE INSTRUCTIONS  Today you may go back to school or work after your visit. You must wait 24 hours after your IUD is put in before you can use tampons, take a bath, or have vaginal sex.  You may have more cramps or heavier bleeding with your periods, or spotting between your periods. This is normal. The cramping and bleeding can last for 3-6 months with the Mirena and Palau IUDs. After 6 months, the cramping and bleeding should get better. Many women will stop having periods after 1 or 2 years with the [place] and [place] IUDs. If you have the Paragard (copper) IUD, you may have more cramping and more bleeding with your periods as long as you have the IUD inside you.  Ibuprofen helps decrease the bleeding and cramping. You can take as many as 4 pills (800 mg) of Ibuprofen every 8 hours with food (each pill contains 200 mg).  Your IUD may come out by itself in the first three months. If you can feel the strings, the IUD is in the right place. If your IUD comes out, you can become pregnant immediately. If you are not sure how to check the strings, we can help you. Meanwhile, use condoms.  Your IUD does not protect against sexually transmitted infections including the HIV virus, genital warts (HPV), gonorrhea, chlamydia, trichomonas, syphilis and herpes. Condoms should be used to decrease the risk of sexually transmitted infections. If you think that you have been exposed to a sexually transmitted infection, please call the clinic. Most infections can be treated WITHOUT removing your IUD.  If you had your IUD placed for birth control, it is effective immediately if it was inserted within five days after the start of your period. If you have it inserted at any other time during your menstrual cycle, use another method of birth control, like condoms for at least 7 days.  Warning Signs Call the clinic if any of the following occurs:  You have fever (over 101F ) or chills.  The implant  comes out or you have concerns about its location.  You have a positive pregnancy test or suspect you might be pregnant.

## 2023-03-26 NOTE — Addendum Note (Signed)
Addended by: Cornelius Moras D on: 03/26/2023 02:04 PM   Modules accepted: Orders

## 2023-04-25 ENCOUNTER — Ambulatory Visit: Payer: BC Managed Care – PPO | Admitting: Obstetrics

## 2023-05-16 ENCOUNTER — Other Ambulatory Visit: Payer: Self-pay | Admitting: Family Medicine

## 2023-05-16 DIAGNOSIS — E890 Postprocedural hypothyroidism: Secondary | ICD-10-CM

## 2023-05-17 ENCOUNTER — Other Ambulatory Visit: Payer: Self-pay | Admitting: Family Medicine

## 2023-05-17 DIAGNOSIS — E890 Postprocedural hypothyroidism: Secondary | ICD-10-CM

## 2023-05-18 NOTE — Telephone Encounter (Signed)
 Requested Prescriptions  Pending Prescriptions Disp Refills   levothyroxine  (SYNTHROID ) 175 MCG tablet [Pharmacy Med Name: LEVOTHYROXINE  175 MCG TABLET] 45 tablet 1    Sig: ALTERNATE TAKING BY MOUTH EVERY OTHER DAY WITH 150 MCG     Endocrinology:  Hypothyroid Agents Passed - 05/18/2023  3:49 PM      Passed - TSH in normal range and within 360 days    TSH  Date Value Ref Range Status  03/12/2023 1.610 0.450 - 4.500 uIU/mL Final         Passed - Valid encounter within last 12 months    Recent Outpatient Visits           2 months ago Postsurgical hypothyroidism   [facility] [place], Jon HERO, MD   8 months ago Encounter for annual physical exam   [facility] [place], Jon HERO, MD   1 year ago Postsurgical hypothyroidism   [facility] [place], Jon HERO, MD   1 year ago Encounter for annual physical exam   [facility] [place], Jon HERO, MD   1 year ago Encounter for annual physical exam   [facility] [place], Jon HERO, MD       Future Appointments             In 3 months Bacigalupo, Jon HERO, MD [facility]

## 2023-06-26 ENCOUNTER — Encounter: Payer: Self-pay | Admitting: Family Medicine

## 2023-07-10 ENCOUNTER — Telehealth: Payer: Self-pay | Admitting: Family Medicine

## 2023-07-10 ENCOUNTER — Encounter: Payer: Self-pay | Admitting: Family Medicine

## 2023-07-10 DIAGNOSIS — F909 Attention-deficit hyperactivity disorder, unspecified type: Secondary | ICD-10-CM

## 2023-07-10 DIAGNOSIS — E890 Postprocedural hypothyroidism: Secondary | ICD-10-CM

## 2023-07-10 MED ORDER — LISDEXAMFETAMINE DIMESYLATE 40 MG PO CAPS: 40.0000 mg | ORAL_CAPSULE | ORAL | 0 refills | Status: DC

## 2023-07-10 MED ORDER — LISDEXAMFETAMINE DIMESYLATE 40 MG PO CAPS: 40.0000 mg | ORAL_CAPSULE | Freq: Every day | ORAL | 0 refills | Status: DC

## 2023-07-10 MED ORDER — LISDEXAMFETAMINE DIMESYLATE 20 MG PO CAPS: 20.0000 mg | ORAL_CAPSULE | Freq: Every day | ORAL | 0 refills | Status: DC

## 2023-07-10 MED ORDER — LEVOTHYROXINE SODIUM 175 MCG PO TABS: ORAL_TABLET | ORAL | 1 refills | Status: DC

## 2023-07-10 MED ORDER — LEVOTHYROXINE SODIUM 150 MCG PO TABS: ORAL_TABLET | ORAL | 7 refills | Status: DC

## 2023-07-10 NOTE — Assessment & Plan Note (Signed)
 Currently on Vyvanse 40 mg in the morning and 20 mg in the afternoon as needed, particularly on longer workdays. The regimen effectively controls symptoms without significant sleep disturbances. Plan to prescribe a three-month supply to reduce visit frequency. No insurance issues reported. - Prescribe Vyvanse 40 mg for morning use, three months' supply - Prescribe Vyvanse 20 mg for afternoon use as needed, three months' supply - Schedule follow-up every four months to monitor efficacy and side effects

## 2023-07-10 NOTE — Assessment & Plan Note (Signed)
 On alternating doses of Synthroid (levothyroxine) 150 mcg and 175 mcg. [facility] due to issues with the current [facility]. - Prescribe Synthroid 150 mcg and 175 mcg, alternating doses - [facility] [address] [place]

## 2023-07-10 NOTE — Progress Notes (Signed)
 MyChart Video Visit    Virtual Visit via Video Note   This format is felt to be most appropriate for this patient at this time. Physical exam was limited by quality of the video and audio technology used for the visit.    Patient location: home Provider location: [facility] Persons involved in the visit: patient, provider  I discussed the limitations of evaluation and management by telemedicine and the availability of in person appointments. The patient expressed understanding and agreed to proceed.  Patient: Kristen Davis   DOB: 22-May-2000   23 y.o. Female  MRN: [id number] Visit Date: 07/10/2023  Today's healthcare provider: Shirlee Latch, MD   No chief complaint on file.  Subjective    HPI   Discussed the use of AI scribe software for clinical note transcription with the patient, who gave verbal consent to proceed.  History of Present Illness   The patient, with a history of ADHD and hypothyroidism, presents for a Vyvanse refill. She has been on Vyvanse for approximately two years, taking 40mg  in the morning and an additional 20mg  in the afternoon as needed for longer work days. The patient reports that the medication is controlling her ADHD symptoms well and she has adapted to the initial sleep disturbances caused by the afternoon dose. She takes the generic form of Vyvanse and has not had any issues with insurance coverage.  In addition to ADHD, the patient also has hypothyroidism and is on alternating doses of Synthroid (150mg  and 175mg ). She recently had her IUD removed and reinserted without any complications.       Review of Systems      Objective    There were no vitals taken for this visit.      Physical Exam Constitutional:      General: She is not in acute distress.    Appearance: Normal appearance.  HENT:     Head: Normocephalic.  Pulmonary:     Effort: Pulmonary effort is normal. No respiratory distress.  Neurological:      Mental Status: She is alert and oriented to person, place, and time. Mental status is at baseline.        Assessment & Plan     Problem List Items Addressed This Visit       Endocrine   Postsurgical hypothyroidism   On alternating doses of Synthroid (levothyroxine) 150 mcg and 175 mcg. [facility] due to [facility] with the current [facility]. - Prescribe Synthroid 150 mcg and 175 mcg, alternating doses - [facility] [address], [place] [employer]      Relevant Medications   levothyroxine (SYNTHROID) 150 MCG tablet   levothyroxine (SYNTHROID) 175 MCG tablet     Other   ADHD - Primary   Currently on Vyvanse 40 mg in the morning and 20 mg in the afternoon as needed, particularly on longer workdays. The regimen effectively controls symptoms without significant sleep disturbances. Plan to prescribe a three-month supply to reduce visit frequency. No insurance issues reported. - Prescribe Vyvanse 40 mg for morning use, three months' supply - Prescribe Vyvanse 20 mg for afternoon use as needed, three months' supply - Schedule follow-up every four months to monitor efficacy and side effects           General Health Maintenance Upcoming physical examination scheduled for next month. - Conduct physical examination next month - Schedule routine follow-ups every four months.  Meds ordered this encounter  Medications   lisdexamfetamine (VYVANSE) 40 MG capsule    Sig: Take 1 capsule (40 mg total) by mouth every morning.    Dispense:  30 capsule    Refill:  0    Do not fill <30 days from last refill   lisdexamfetamine (VYVANSE) 40 MG capsule    Sig: Take 1 capsule (40 mg total) by mouth daily.    Dispense:  30 capsule    Refill:  0    Do not fill <30 days from last refill   lisdexamfetamine (VYVANSE) 20 MG capsule    Sig: Take 1 capsule (20 mg total) by mouth daily in the afternoon.    Dispense:  30 capsule     Refill:  0    Do not fill <30 days from last refill   lisdexamfetamine (VYVANSE) 20 MG capsule    Sig: Take 1 capsule (20 mg total) by mouth daily in the afternoon.    Dispense:  30 capsule    Refill:  0    Do not fill <30 days from last refill   lisdexamfetamine (VYVANSE) 20 MG capsule    Sig: Take 1 capsule (20 mg total) by mouth daily in the afternoon.    Dispense:  30 capsule    Refill:  0    Do not fill <30 days from last refill   lisdexamfetamine (VYVANSE) 40 MG capsule    Sig: Take 1 capsule (40 mg total) by mouth every morning.    Dispense:  30 capsule    Refill:  0    Do not fill <30 days from last refill   levothyroxine (SYNTHROID) 150 MCG tablet    Sig: Alternate every other day with 175 mcg pills    Dispense:  45 tablet    Refill:  7   levothyroxine (SYNTHROID) 175 MCG tablet    Sig: ALTERNATE TAKING BY MOUTH EVERY OTHER DAY WITH 150 MCG    Dispense:  45 tablet    Refill:  1     No follow-ups on file.     I discussed the assessment and treatment plan with the patient. The patient was provided an opportunity to ask questions and all were answered. The patient agreed with the plan and demonstrated an understanding of the instructions.   The patient was advised to call back or seek an in-person evaluation if the symptoms worsen or if the condition fails to improve as anticipated.   Shirlee Latch, MD [facility] [phone] (phone) [phone] (fax)  [facility]

## 2023-07-31 ENCOUNTER — Other Ambulatory Visit: Payer: Self-pay | Admitting: Family Medicine

## 2023-07-31 MED ORDER — LISDEXAMFETAMINE DIMESYLATE 40 MG PO CAPS: 40.0000 mg | ORAL_CAPSULE | ORAL | 0 refills | Status: DC

## 2023-08-15 ENCOUNTER — Encounter: Payer: Self-pay | Admitting: Family Medicine

## 2023-09-01 ENCOUNTER — Other Ambulatory Visit: Payer: Self-pay | Admitting: Family Medicine

## 2023-09-03 ENCOUNTER — Ambulatory Visit (INDEPENDENT_AMBULATORY_CARE_PROVIDER_SITE_OTHER): Payer: Self-pay | Admitting: Family Medicine

## 2023-09-03 ENCOUNTER — Encounter: Payer: Self-pay | Admitting: Family Medicine

## 2023-09-03 VITALS — BP 118/82 | HR 79 | Ht 71.0 in | Wt 224.9 lb

## 2023-09-03 DIAGNOSIS — F909 Attention-deficit hyperactivity disorder, unspecified type: Secondary | ICD-10-CM

## 2023-09-03 DIAGNOSIS — Z23 Encounter for immunization: Secondary | ICD-10-CM

## 2023-09-03 DIAGNOSIS — E890 Postprocedural hypothyroidism: Secondary | ICD-10-CM | POA: Diagnosis not present

## 2023-09-03 DIAGNOSIS — Z0001 Encounter for general adult medical examination with abnormal findings: Secondary | ICD-10-CM | POA: Diagnosis not present

## 2023-09-03 DIAGNOSIS — Z0000 Encounter for general adult medical examination without abnormal findings: Secondary | ICD-10-CM

## 2023-09-03 MED ORDER — LISDEXAMFETAMINE DIMESYLATE 40 MG PO CAPS: 40.0000 mg | ORAL_CAPSULE | ORAL | 0 refills | Status: DC

## 2023-09-03 MED ORDER — LISDEXAMFETAMINE DIMESYLATE 20 MG PO CAPS: 20.0000 mg | ORAL_CAPSULE | Freq: Every day | ORAL | 0 refills | Status: DC

## 2023-09-03 MED ORDER — LISDEXAMFETAMINE DIMESYLATE 40 MG PO CAPS: 40.0000 mg | ORAL_CAPSULE | Freq: Every day | ORAL | 0 refills | Status: DC

## 2023-09-03 MED ORDER — LEVOTHYROXINE SODIUM 150 MCG PO TABS: ORAL_TABLET | ORAL | 7 refills | Status: DC

## 2023-09-03 MED ORDER — LEVOTHYROXINE SODIUM 175 MCG PO TABS: ORAL_TABLET | ORAL | 5 refills | Status: DC

## 2023-09-03 NOTE — Assessment & Plan Note (Signed)
 Continued management of hypothyroidism with Synthroid . Advised to avoid taking fiber supplements simultaneously with Synthroid  to prevent absorption interference. - Continue Synthroid  175 mcg alternating with 115 mcg - Schedule six-month follow-up for thyroid  management

## 2023-09-03 NOTE — Progress Notes (Signed)
 Complete physical exam   Patient: Kristen Davis   DOB: 12-Aug-2000   23 y.o. Female  MRN: [id number] Visit Date: 09/03/2023  Today's healthcare provider: Aden Agreste, MD   Chief Complaint  Patient presents with   Annual Exam    Last completed 08/24/22 Diet -  General Exercise - three to four times a week for one hour Feeling - well Sleeping - fairly well Concerns - none    Subjective    Kristen Davis is a 23 y.o. female who presents today for a complete physical exam.   Discussed the use of AI scribe software for clinical note transcription with the patient, who gave verbal consent to proceed.  History of Present Illness   The patient, with a history of thyroid  issues and attention deficit disorder, presents for a physical and to receive a Hepatitis B vaccine for work. The patient has previously been vaccinated for Hepatitis B but her titer levels were low, indicating a non-response. The patient is currently on Synthroid  and Vyvanse . The patient reports that she has recently started taking fiber supplements and has questions about the timing of taking these supplements in relation to her Synthroid  medication. The patient reports no new symptoms or issues.        Last depression screening scores    09/03/2023    1:24 PM 02/26/2023    1:16 PM 08/24/2022    8:30 AM  PHQ 2/9 Scores  PHQ - 2 Score 2 0 0  PHQ- 9 Score 6 4 2    Last fall risk screening    09/03/2023    1:24 PM  Fall Risk   Falls in the past year? 0  Number falls in past yr: 0  Injury with Fall? 0  Risk for fall due to : No Fall Risks  Follow up Falls evaluation completed        Medications: Outpatient Medications Prior to Visit  Medication Sig   albuterol  (VENTOLIN  HFA) 108 (90 Base) MCG/ACT inhaler Inhale into the lungs.   hydrOXYzine  (VISTARIL ) 100 MG capsule Take 1 capsule (100 mg total) by mouth 3 (three) times daily as needed (for anxiety).   Levonorgestrel  19.5 MG IUD 19.5 mg by  Intrauterine route once.   spironolactone (ALDACTONE) 50 MG tablet    tretinoin (RETIN-A) 0.05 % cream    [DISCONTINUED] levothyroxine  (SYNTHROID ) 150 MCG tablet Alternate every other day with 175 mcg pills   [DISCONTINUED] levothyroxine  (SYNTHROID ) 175 MCG tablet ALTERNATE TAKING BY MOUTH EVERY OTHER DAY WITH 150 MCG   [DISCONTINUED] lisdexamfetamine (VYVANSE ) 20 MG capsule Take 1 capsule (20 mg total) by mouth daily in the afternoon.   [DISCONTINUED] lisdexamfetamine (VYVANSE ) 20 MG capsule Take 1 capsule (20 mg total) by mouth daily in the afternoon.   [DISCONTINUED] lisdexamfetamine (VYVANSE ) 20 MG capsule Take 1 capsule (20 mg total) by mouth daily in the afternoon.   [DISCONTINUED] lisdexamfetamine (VYVANSE ) 40 MG capsule Take 1 capsule (40 mg total) by mouth daily.   [DISCONTINUED] lisdexamfetamine (VYVANSE ) 40 MG capsule Take 1 capsule (40 mg total) by mouth every morning.   [DISCONTINUED] lisdexamfetamine (VYVANSE ) 40 MG capsule Take 1 capsule (40 mg total) by mouth every morning.   No facility-administered medications prior to visit.    Review of Systems    Objective    BP 118/82   Pulse 79   Ht 5\' 11"  (1.803 m)   Wt 224 lb 14.4 oz (102 kg)   BMI 31.37 kg/m  Physical Exam Vitals reviewed.  Constitutional:      General: She is not in acute distress.    Appearance: Normal appearance. She is well-developed. She is not diaphoretic.  HENT:     Head: Normocephalic and atraumatic.     Right Ear: Tympanic membrane, ear canal and external ear normal.     Left Ear: Tympanic membrane, ear canal and external ear normal.     Nose: Nose normal.     Mouth/Throat:     Mouth: Mucous membranes are moist.     Pharynx: Oropharynx is clear. No oropharyngeal exudate.  Eyes:     General: No scleral icterus.    Conjunctiva/sclera: Conjunctivae normal.     Pupils: Pupils are equal, round, and reactive to light.  Neck:     Comments: Thyroid  surgically absent Cardiovascular:     Rate  and Rhythm: Normal rate and regular rhythm.     Heart sounds: Normal heart sounds. No murmur heard. Pulmonary:     Effort: Pulmonary effort is normal. No respiratory distress.     Breath sounds: Normal breath sounds. No wheezing or rales.  Abdominal:     General: There is no distension.     Palpations: Abdomen is soft.     Tenderness: There is no abdominal tenderness.  Musculoskeletal:        General: No deformity.     Cervical back: Neck supple.     Right lower leg: No edema.     Left lower leg: No edema.  Lymphadenopathy:     Cervical: No cervical adenopathy.  Skin:    General: Skin is warm and dry.     Findings: No rash.  Neurological:     Mental Status: She is alert and oriented to person, place, and time. Mental status is at baseline.     Gait: Gait normal.  Psychiatric:        Mood and Affect: Mood normal.        Behavior: Behavior normal.        Thought Content: Thought content normal.      No results found for any visits on 09/03/23.  Assessment & Plan    Routine Health Maintenance and Physical Exam  Exercise Activities and Dietary recommendations  Goals   None     Immunization History  Administered Date(s) Administered   DTaP 01/17/2001, 03/06/2001, 04/19/2001, 04/18/2002, 01/27/2005   HIB (PRP-OMP) 01/17/2001, 03/06/2001, 04/19/2001, 11/25/2001   HPV 9-valent 12/03/2014   HPV Quadrivalent 06/03/2012, 03/13/2013, 12/03/2014   Hep B, Unspecified 12/06/2000   Hepatitis A 03/13/2006, 03/27/2007   Hepatitis B 2001/01/09, 08/16/2001   Hepb-cpg 09/03/2023   IPV 01/17/2001, 03/06/2001, 04/18/2002, 01/27/2005   Influenza Inj Mdck Quad Pf 01/03/2023   Influenza,inj,Quad PF,6+ Mos 03/13/2013, 03/17/2014, 01/22/2018   Influenza-Unspecified 03/17/2014, 03/16/2021, 02/10/2022   MMR 11/25/2001, 01/27/2005   Meningococcal Conjugate 03/13/2013   Moderna Sars-Covid-2 Vaccination 01/27/2020, 02/05/2020   PPD Test 01/03/2023   Pneumococcal Conjugate PCV 7 03/06/2001,  04/19/2001, 08/16/2001, 04/18/2002   Pneumococcal-Unspecified 03/06/2001, 04/19/2001, 08/16/2001, 04/18/2002   Td 05/16/2011   Tdap 05/16/2011, 06/06/2021   Varicella 11/25/2001, 03/13/2006    Health Maintenance  Topic Date Due   Meningococcal B Vaccine (1 of 2 - Standard) Never done   COVID-19 Vaccine (4 - 2024-25 season) 01/07/2023   CHLAMYDIA SCREENING  08/24/2023   INFLUENZA VACCINE  12/07/2023   Cervical Cancer Screening (Pap smear)  08/23/2025   DTaP/Tdap/Td (9 - Td or Tdap) 06/07/2031   HPV VACCINES  Completed  Hepatitis C Screening  Completed   HIV Screening  Completed    Discussed health benefits of physical activity, and encouraged her to engage in regular exercise appropriate for her age and condition.  Problem List Items Addressed This Visit       Endocrine   Postsurgical hypothyroidism   Continued management of hypothyroidism with Synthroid . Advised to avoid taking fiber supplements simultaneously with Synthroid  to prevent absorption interference. - Continue Synthroid  175 mcg alternating with 115 mcg - Schedule six-month follow-up for thyroid  management      Relevant Medications   levothyroxine  (SYNTHROID ) 150 MCG tablet   levothyroxine  (SYNTHROID ) 175 MCG tablet     Other   ADHD   Ongoing management of ADHD with Vyvanse . She uses 40 mg in the morning and 20 mg in the afternoon as needed for focus. - Continue Vyvanse  40 mg in the morning and 20 mg in the afternoon as needed - [facility] [place] - Schedule six-month follow-up for ADHD management       Other Visit Diagnoses       Encounter for annual physical exam    -  Primary   Relevant Orders   Comprehensive metabolic panel with GFR   Lipid Panel With LDL/HDL Ratio   TSH     Need for hepatitis B booster vaccination       Relevant Orders   Heplisav-B (HepB-CPG) Vaccine (Completed)          Wellness Visit Routine wellness visit with review of current medications,  vaccination status, lab work, and screenings. Hepatitis B booster administered due to non-responder status. Documentation for work and Database administrator entry discussed. - Perform comprehensive lab panel including thyroid , kidney, liver function, and cholesterol - Provide documentation of hepatitis B vaccination for work - Schedule next Pap smear in 2027 - Ensure tetanus vaccination is up to date until 2033 - Plan for flu vaccination in the fall       Return in about 6 months (around 03/04/2024) for chronic disease f/u, as scheduled.     Aden Agreste, MD  [facility] [phone] (phone) [phone] (fax)  [facility]

## 2023-09-03 NOTE — Assessment & Plan Note (Signed)
 Ongoing management of ADHD with Vyvanse . She uses 40 mg in the morning and 20 mg in the afternoon as needed for focus. - Continue Vyvanse  40 mg in the morning and 20 mg in the afternoon as needed - [facility] [place] - Schedule six-month follow-up for ADHD management

## 2023-09-04 LAB — LIPID PANEL WITH LDL/HDL RATIO
Cholesterol, Total: 155 mg/dL (ref 100–199)
HDL: 46 mg/dL (ref >39)
LDL Chol Calc (NIH): 93 mg/dL (ref 0–99)
LDL/HDL Ratio: 2 ratio (ref 0.0–3.2)
Triglycerides: 87 mg/dL (ref 0–149)
VLDL Cholesterol Cal: 16 mg/dL (ref 5–40)

## 2023-09-04 LAB — COMPREHENSIVE METABOLIC PANEL WITH GFR
ALT: 12 IU/L (ref 0–32)
AST: 17 IU/L (ref 0–40)
Albumin: 4.9 g/dL (ref 4.0–5.0)
Alkaline Phosphatase: 70 IU/L (ref 44–121)
BUN/Creatinine Ratio: 16 (ref 9–23)
BUN: 13 mg/dL (ref 6–20)
Bilirubin Total: 0.6 mg/dL (ref 0.0–1.2)
CO2: 24 mmol/L (ref 20–29)
Calcium: 10.4 mg/dL — ABNORMAL HIGH (ref 8.7–10.2)
Chloride: 102 mmol/L (ref 96–106)
Creatinine, Ser: 0.83 mg/dL (ref 0.57–1.00)
Globulin, Total: 2.5 g/dL (ref 1.5–4.5)
Glucose: 86 mg/dL (ref 70–99)
Potassium: 4.9 mmol/L (ref 3.5–5.2)
Sodium: 140 mmol/L (ref 134–144)
Total Protein: 7.4 g/dL (ref 6.0–8.5)
eGFR: 102 mL/min/{1.73_m2} (ref >59)

## 2023-09-04 LAB — TSH: TSH: 0.161 u[IU]/mL — ABNORMAL LOW (ref 0.450–4.500)

## 2023-09-05 ENCOUNTER — Encounter: Payer: Self-pay | Admitting: Family Medicine

## 2023-11-13 ENCOUNTER — Telehealth: Payer: Self-pay

## 2023-11-13 DIAGNOSIS — E890 Postprocedural hypothyroidism: Secondary | ICD-10-CM

## 2023-11-13 NOTE — Telephone Encounter (Signed)
 Copied from CRM [id number]. Topic: Clinical - Lab/Test Results >> Nov 12, 2023  4:33 PM Nathanel BROCKS wrote: Reason for CRM: pt is wanting for her lab draws to be done in [place]. Can you please transfer them. [facility] in [place] of [address] phone [phone].  Please advise pt.

## 2023-11-20 ENCOUNTER — Encounter: Payer: Self-pay | Admitting: Family Medicine

## 2023-11-28 LAB — COMPREHENSIVE METABOLIC PANEL WITH GFR
ALT: 8 IU/L (ref 0–32)
AST: 12 IU/L (ref 0–40)
Albumin: 4.6 g/dL (ref 4.0–5.0)
Alkaline Phosphatase: 66 IU/L (ref 44–121)
BUN/Creatinine Ratio: 20 (ref 9–23)
BUN: 17 mg/dL (ref 6–20)
Bilirubin Total: 0.5 mg/dL (ref 0.0–1.2)
CO2: 21 mmol/L (ref 20–29)
Calcium: 9.9 mg/dL (ref 8.7–10.2)
Chloride: 103 mmol/L (ref 96–106)
Creatinine, Ser: 0.87 mg/dL (ref 0.57–1.00)
Globulin, Total: 2.7 g/dL (ref 1.5–4.5)
Glucose: 96 mg/dL (ref 70–99)
Potassium: 4.6 mmol/L (ref 3.5–5.2)
Sodium: 138 mmol/L (ref 134–144)
Total Protein: 7.3 g/dL (ref 6.0–8.5)
eGFR: 96 mL/min/1.73 (ref >59)

## 2023-11-28 LAB — TSH: TSH: 0.975 u[IU]/mL (ref 0.450–4.500)

## 2023-11-29 ENCOUNTER — Ambulatory Visit: Payer: Self-pay | Admitting: Family Medicine

## 2023-12-27 ENCOUNTER — Ambulatory Visit: Payer: Self-pay

## 2023-12-27 NOTE — Telephone Encounter (Signed)
 FYI Only or Action Required?: FYI only for provider.  Patient was last seen in primary care on 09/03/2023 by Myrla Jon HERO, MD.  Called Nurse Triage reporting Depression.  Symptoms began several days ago.  Interventions attempted: Nothing.  Symptoms are: gradually worsening.  Triage Disposition: See Physician Within 24 Hours  Patient/caregiver understands and will follow disposition?: Yes       Copied from CRM #[id number]. Topic: Clinical - Red Word Triage >> Dec 27, 2023 11:45 AM Turkey B wrote: [facility] that prompted transfer to Nurse Triage: Patient has depression going on Reason for Disposition  [1] Depression AND [2] getting worse (e.g., sleeping poorly, less able to do activities of daily living)  Answer Assessment - Initial Assessment Questions 1. CONCERN: What happened that made you call today?     States been having depression 2. DEPRESSION SYMPTOM SCREENING: How are you feeling overall? (e.g., decreased energy, increased sleeping or difficulty sleeping, difficulty concentrating, feelings of sadness, guilt, hopelessness, or worthlessness)     Little to no motivation, tearful, anxious, low libido, anger, difficulty sleeping 3. RISK OF HARM - SUICIDAL IDEATION:  Do you ever have thoughts of hurting or killing yourself?  (e.g., yes, no, no but preoccupation with thoughts about death)     denies 4. RISK OF HARM - HOMICIDAL IDEATION:  Do you ever have thoughts of hurting or killing someone else?  (e.g., yes, no, no but preoccupation with thoughts about death)     denies 5. FUNCTIONAL IMPAIRMENT: How have things been going for you overall? Have you had more difficulty than usual doing your normal daily activities?  (e.g., better, same, worse; self-care, school, work, interactions)     yes 6. SUPPORT: Who is with you now? Who do you live with? Do you have family or friends who you can talk to?      yes 7. THERAPIST: Do you have a counselor or  therapist? If Yes, ask: What is their name?     no 8. STRESSORS: Has there been any new stress or recent changes in your life?     Started a new job, going back to school and planning a weeding 9. ALCOHOL USE OR SUBSTANCE USE (DRUG USE): Do you drink alcohol or use any illegal drugs?     no 10. OTHER: Do you have any other physical symptoms right now? (e.g., fever)       Lower back pain 11. PREGNANCY: Is there any chance you are pregnant? When was your last menstrual period?       na  Protocols used: Depression-A-AH

## 2023-12-28 ENCOUNTER — Ambulatory Visit: Admitting: Physician Assistant

## 2023-12-28 ENCOUNTER — Telehealth: Admitting: Physician Assistant

## 2023-12-28 ENCOUNTER — Ambulatory Visit: Payer: Self-pay | Admitting: *Deleted

## 2023-12-28 ENCOUNTER — Encounter: Payer: Self-pay | Admitting: Physician Assistant

## 2023-12-28 DIAGNOSIS — F339 Major depressive disorder, recurrent, unspecified: Secondary | ICD-10-CM | POA: Insufficient documentation

## 2023-12-28 DIAGNOSIS — F419 Anxiety disorder, unspecified: Secondary | ICD-10-CM | POA: Diagnosis not present

## 2023-12-28 DIAGNOSIS — F321 Major depressive disorder, single episode, moderate: Secondary | ICD-10-CM

## 2023-12-28 NOTE — Progress Notes (Signed)
 MyChart Video Visit  Virtual Visit via Video Note   This format is felt to be most appropriate for this patient at this time. Physical exam was limited by quality of the video and audio technology used for the visit.   Provider location: Virtual Visit Location Provider: Office/Clinic Patient Location: Home  I discussed the limitations of evaluation and management by telemedicine and the availability of in person appointments. The patient expressed understanding and agreed to proceed.  Patient: Kristen Davis   DOB: 11/13/2000   23 y.o. Female  MRN: [id number] Visit Date: 12/28/2023  Today's healthcare provider: Jolynn Spencer, PA-C   No chief complaint on file.  Subjective     Discussed the use of AI scribe software for clinical note transcription with the patient, who gave verbal consent to proceed.  History of Present Illness Kristen Davis is a 23 year old female who presents with depression and anxiety symptoms.  She experiences depression intermittently for about a year, with recent exacerbation due to increased stress. Over the past two weeks, she has anhedonia nearly every day and feels down, depressed, or hopeless more than half of the days. Insomnia occurs almost daily, accompanied by fatigue and either poor appetite or overeating more than half of the days. Low self-esteem is present more than half of the days, and she has difficulty concentrating almost every day. There are no thoughts of self-harm. Lack of sleep significantly impacts her daily functioning. She is not on antidepressants but uses hydroxyzine  as needed for anxiety. She previously found counseling beneficial but does not currently pursue it.  Regarding anxiety, over the last two weeks, she feels nervous or on edge several days, with difficulty controlling worry and excessive worrying about various issues. She experiences trouble relaxing, restlessness, and irritability more than half of the days. There is no sense  of impending doom.  She denies smoking or using vaping products. She has intentionally lost weight as part of a weight loss effort. She is currently on medication for ADHD.     Medications: Outpatient Medications Prior to Visit  Medication Sig   albuterol  (VENTOLIN  HFA) 108 (90 Base) MCG/ACT inhaler Inhale into the lungs.   hydrOXYzine  (VISTARIL ) 100 MG capsule Take 1 capsule (100 mg total) by mouth 3 (three) times daily as needed (for anxiety).   Levonorgestrel  19.5 MG IUD 19.5 mg by Intrauterine route once.   levothyroxine  (SYNTHROID ) 150 MCG tablet Alternate every other day with 175 mcg pills   levothyroxine  (SYNTHROID ) 175 MCG tablet ALTERNATE TAKING BY MOUTH EVERY OTHER DAY WITH 150 MCG   lisdexamfetamine (VYVANSE ) 20 MG capsule Take 1 capsule (20 mg total) by mouth daily in the afternoon.   lisdexamfetamine (VYVANSE ) 20 MG capsule Take 1 capsule (20 mg total) by mouth daily in the afternoon.   lisdexamfetamine (VYVANSE ) 20 MG capsule Take 1 capsule (20 mg total) by mouth daily in the afternoon.   lisdexamfetamine (VYVANSE ) 40 MG capsule Take 1 capsule (40 mg total) by mouth daily.   lisdexamfetamine (VYVANSE ) 40 MG capsule Take 1 capsule (40 mg total) by mouth every morning.   lisdexamfetamine (VYVANSE ) 40 MG capsule Take 1 capsule (40 mg total) by mouth every morning.   spironolactone (ALDACTONE) 50 MG tablet    tretinoin (RETIN-A) 0.05 % cream    No facility-administered medications prior to visit.    Review of Systems All negative  Except see HPI       Objective    There were no vitals taken for  this visit.       Physical Exam  Constitutional:      General: She is not in acute distress.    Appearance: Normal appearance.  HENT:     Head: Normocephalic.  Pulmonary:     Effort: Pulmonary effort is normal. No respiratory distress.  Neurological:     Mental Status: She is alert and oriented to person, place, and time. Mental status is at baseline.        Assessment & Plan Major depressive disorder, recurrent, unspecified Chronic Moderately severe depression with significant impairment in daily functioning, particularly due to insomnia. No prior antidepressant use. Counseling previously beneficial. Combination of counseling and pharmacotherapy recommended for optimal results. - Refer to in-clinic counseling services for integrated care with psychiatrist oversight. - Complete anxiety screening for comprehensive treatment planning. - Discuss potential antidepressant options with psychiatrist to ensure compatibility with current ADHD medication. - Educated on importance of not abruptly stopping antidepressants and potential delayed onset of effects, up to six weeks.  Anxiety disorder, unspecified Mild anxiety symptoms managed with PRN hydroxyzine . Anxiety less prominent than depression. - Complete anxiety screening for comprehensive treatment planning. - Refer to in-clinic counseling services for integrated care with psychiatrist oversight. Collaboration of Care: Medication Management AEB  , Primary Care Provider AEB  , Psychiatrist AEB  , and Referral or follow-up with counselor/therapist AEB    Patient/Guardian was advised Release of Information must be obtained prior to any record release in order to collaborate their care with an outside provider. Patient/Guardian was advised if they have not already done so to contact the registration department to sign all necessary forms in order for us  to release information regarding their care.   Consent: Patient/Guardian gives verbal consent for treatment and assignment of benefits for services provided during this visit. Patient/Guardian expressed understanding and agreed to proceed.    No follow-ups on file.     I discussed the assessment and treatment plan with the patient. The patient was provided an opportunity to ask questions and all were answered. The patient agreed with the plan and  demonstrated an understanding of the instructions.   The patient was advised to call back or seek an in-person evaluation if the symptoms worsen or if the condition fails to improve as anticipated.  I, Gertrude Tarbet, PA-C have reviewed all documentation for this visit. The documentation on 12/28/2023  for the exam, diagnosis, procedures, and orders are all accurate and complete.  Jolynn Spencer, [facility], MMS [facility] [phone] (phone) [phone] (fax)  [facility]

## 2023-12-28 NOTE — Telephone Encounter (Signed)
 FYI Only or Action Required?: FYI only for provider.  Patient was last seen in primary care on 09/03/2023 by Myrla Jon HERO, MD.  Called Nurse Triage reporting Appointment.  Symptoms began n/a.  Interventions attempted: Other: n/a.  Symptoms are: patient wants to change appointment to virtual visit- already triaged and scheduled  .  Triage Disposition: Information or Advice Only Call  Patient/caregiver understands and will follow disposition?: yes- call to CAL- appointment changed    Reason for Disposition  [1] Follow-up call to recent contact AND [2] information only call, no triage required  Answer Assessment - Initial Assessment Questions 1. REASON FOR CALL: What is the main reason for your call? or How can I best help you?     Patient would like to change her appointment to virtual 2. SYMPTOMS : Do you have any symptoms?      Patient has discussed her symptoms with provider before- in March- she states she told provider she would call back if she felt she needed medication start. Call to office- appointment has been changed.  Protocols used: Information Only Call - No Triage-A-AH   Copied from CRM [id number]. Topic: Clinical - Red Word Triage >> Dec 28, 2023 11:02 AM Willma SAUNDERS wrote: [facility] that prompted transfer to Nurse Triage: Patient is scheduled today for a visit for depression, wants to see if it can be changed to a video visit.

## 2024-01-29 ENCOUNTER — Ambulatory Visit: Admitting: Physician Assistant

## 2024-03-04 ENCOUNTER — Ambulatory Visit: Admitting: Family Medicine

## 2024-03-11 ENCOUNTER — Ambulatory Visit (INDEPENDENT_AMBULATORY_CARE_PROVIDER_SITE_OTHER): Admitting: Family Medicine

## 2024-03-11 ENCOUNTER — Encounter: Payer: Self-pay | Admitting: Family Medicine

## 2024-03-11 VITALS — BP 116/72 | HR 73 | Ht 72.0 in | Wt 210.3 lb

## 2024-03-11 DIAGNOSIS — E890 Postprocedural hypothyroidism: Secondary | ICD-10-CM

## 2024-03-11 DIAGNOSIS — F909 Attention-deficit hyperactivity disorder, unspecified type: Secondary | ICD-10-CM

## 2024-03-11 MED ORDER — LISDEXAMFETAMINE DIMESYLATE 40 MG PO CAPS: 40.0000 mg | ORAL_CAPSULE | ORAL | 0 refills | Status: AC

## 2024-03-11 MED ORDER — LISDEXAMFETAMINE DIMESYLATE 20 MG PO CAPS: 20.0000 mg | ORAL_CAPSULE | Freq: Every day | ORAL | 0 refills | Status: AC

## 2024-03-11 MED ORDER — SPIRONOLACTONE 50 MG PO TABS: 50.0000 mg | ORAL_TABLET | Freq: Two times a day (BID) | ORAL | 5 refills | Status: AC

## 2024-03-11 MED ORDER — LISDEXAMFETAMINE DIMESYLATE 40 MG PO CAPS: 40.0000 mg | ORAL_CAPSULE | Freq: Every day | ORAL | 0 refills | Status: AC

## 2024-03-11 NOTE — Progress Notes (Signed)
 Established patient visit   Patient: Kristen Davis   DOB: [birth year]   23 y.o. Female  MRN: [id number] Visit Date: 03/11/2024  Today's healthcare provider: Jon Eva, MD   Chief Complaint  Patient presents with   Medical Management of Chronic Issues   ADHD   Hypothyroidism    Symptoms: heat intolerance, change in energy level, constipation,    Subjective    HPI HPI     Hypothyroidism    Additional comments: Symptoms: heat intolerance, change in energy level, constipation,       Last edited by Lilian Fitzpatrick, CMA on 03/11/2024  2:38 PM.       Discussed the use of AI scribe software for clinical note transcription with the patient, who gave verbal consent to proceed.  History of Present Illness   Kristen Davis is a 23 year old female with thyroid  issues and ADHD who presents for a routine follow-up.  She is managing her thyroid  condition with a consistent dose of 150 mcg, although she felt better on a higher dose. She experiences no palpitations, hair loss, or trouble sleeping when her TSH is low.  For ADHD, she takes Vyvanse , 40 mg in the morning and 20 mg in the afternoon, which is effective. Her mood and anxiety are stable.  She inquires about extending her spironolactone prescription, which she takes 50 mg twice a day, due to a breakout after a canceled dermatology appointment.  She received a flu shot at work approximately two to three weeks ago. She works at [employer] and is in the process of changing providers due to careers information officer.         Medications: Outpatient Medications Prior to Visit  Medication Sig   albuterol  (VENTOLIN  HFA) 108 (90 Base) MCG/ACT inhaler Inhale into the lungs.   hydrOXYzine  (VISTARIL ) 100 MG capsule Take 1 capsule (100 mg total) by mouth 3 (three) times daily as needed (for anxiety).   Levonorgestrel  19.5 MG IUD 19.5 mg by Intrauterine route once.   levothyroxine  (SYNTHROID ) 150 MCG tablet Alternate every other day  with 175 mcg pills   tretinoin (RETIN-A) 0.05 % cream    [DISCONTINUED] lisdexamfetamine (VYVANSE ) 20 MG capsule Take 1 capsule (20 mg total) by mouth daily in the afternoon.   [DISCONTINUED] lisdexamfetamine (VYVANSE ) 20 MG capsule Take 1 capsule (20 mg total) by mouth daily in the afternoon.   [DISCONTINUED] lisdexamfetamine (VYVANSE ) 20 MG capsule Take 1 capsule (20 mg total) by mouth daily in the afternoon.   [DISCONTINUED] lisdexamfetamine (VYVANSE ) 40 MG capsule Take 1 capsule (40 mg total) by mouth daily.   [DISCONTINUED] lisdexamfetamine (VYVANSE ) 40 MG capsule Take 1 capsule (40 mg total) by mouth every morning.   [DISCONTINUED] lisdexamfetamine (VYVANSE ) 40 MG capsule Take 1 capsule (40 mg total) by mouth every morning.   [DISCONTINUED] spironolactone (ALDACTONE) 50 MG tablet    [DISCONTINUED] levothyroxine  (SYNTHROID ) 175 MCG tablet ALTERNATE TAKING BY MOUTH EVERY OTHER DAY WITH 150 MCG (Patient not taking: Reported on 03/11/2024)   No facility-administered medications prior to visit.    Review of Systems     Objective    BP 116/72 (BP Location: Left Arm, Patient Position: Sitting, Cuff Size: Large)   Pulse 73   Ht 6' (1.829 m)   Wt 210 lb 4.8 oz (95.4 kg)   SpO2 100%   BMI 28.52 kg/m    Physical Exam Vitals reviewed.  Constitutional:      General: She is not in acute distress.  Appearance: Normal appearance. She is well-developed. She is not diaphoretic.  HENT:     Head: Normocephalic and atraumatic.  Eyes:     General: No scleral icterus.    Conjunctiva/sclera: Conjunctivae normal.  Neck:     Thyroid : No thyromegaly.  Cardiovascular:     Rate and Rhythm: Normal rate and regular rhythm.     Heart sounds: Normal heart sounds. No murmur heard. Pulmonary:     Effort: Pulmonary effort is normal. No respiratory distress.     Breath sounds: Normal breath sounds. No wheezing, rhonchi or rales.  Musculoskeletal:     Cervical back: Neck supple.     Right lower leg:  No edema.     Left lower leg: No edema.  Lymphadenopathy:     Cervical: No cervical adenopathy.  Skin:    General: Skin is warm and dry.     Findings: No rash.  Neurological:     Mental Status: She is alert and oriented to person, place, and time. Mental status is at baseline.  Psychiatric:        Mood and Affect: Mood normal.        Behavior: Behavior normal.      No results found for any visits on 03/11/24.  Assessment & Plan     Problem List Items Addressed This Visit       Endocrine   Postsurgical hypothyroidism - Primary   Relevant Orders   TSH     Other   ADHD        Postprocedural hypothyroidism Current TSH levels are on the lower end of normal, but she reports feeling better on a higher dose. No symptoms of hyperthyroidism such as palpitations, hair loss, or trouble sleeping were reported when TSH was low. She prefers the alternating dose regimen as it made her feel better. - Checked TSH levels today - If TSH is not low, will consider returning to alternating dose regimen  Attention-deficit hyperactivity disorder ADHD is well-managed with Vyvanse . She is currently taking 40 mg in the morning and 20 mg in the afternoon. Mood and anxiety are well-controlled. - Continue Vyvanse  40 mg in the morning and 20 mg in the afternoon - Sent Vyvanse  prescription to [facility] on [address]       No follow-ups on file.       Jon Eva, MD  [facility] [phone] (phone) [phone] (fax)  [facility]

## 2024-03-12 ENCOUNTER — Ambulatory Visit: Payer: Self-pay | Admitting: Family Medicine

## 2024-03-12 LAB — TSH: TSH: 0.47 u[IU]/mL (ref 0.450–4.500)

## 2024-03-21 ENCOUNTER — Encounter: Payer: Self-pay | Admitting: Family Medicine

## 2024-03-21 DIAGNOSIS — E890 Postprocedural hypothyroidism: Secondary | ICD-10-CM

## 2024-03-21 MED ORDER — LEVOTHYROXINE SODIUM 150 MCG PO TABS: 150.0000 ug | ORAL_TABLET | Freq: Every day | ORAL | 1 refills | Status: AC

## 2024-03-21 MED ORDER — LEVOTHYROXINE SODIUM 150 MCG PO TABS
150.0000 ug | ORAL_TABLET | Freq: Every day | ORAL | 1 refills | Status: DC
Start: 2024-03-21 — End: 2024-03-21

## 2024-03-21 NOTE — Addendum Note (Signed)
 Addended by: LILIAN SEVERO RAMAN on: 03/21/2024 01:37 PM   Modules accepted: Orders
